# Patient Record
Sex: Female | Born: 1995 | Race: White | Hispanic: No | Marital: Married | Smoking: Former smoker
Health system: Southern US, Community
[De-identification: ages and names within clinical notes are randomized; demographics above are authoritative.]

## PROBLEM LIST (undated history)

## (undated) DIAGNOSIS — Z789 Other specified health status: Secondary | ICD-10-CM

## (undated) HISTORY — PX: TONSILLECTOMY: SUR1361

## (undated) HISTORY — PX: WISDOM TOOTH EXTRACTION: SHX21

## (undated) HISTORY — PX: ADENOIDECTOMY: SUR15

## (undated) HISTORY — DX: Other specified health status: Z78.9

## (undated) HISTORY — PX: TYMPANOSTOMY TUBE PLACEMENT: SHX32

---
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
## 2016-12-04 ENCOUNTER — Encounter (HOSPITAL_COMMUNITY): Payer: Self-pay | Admitting: Emergency Medicine

## 2016-12-04 ENCOUNTER — Ambulatory Visit (HOSPITAL_COMMUNITY)
Admission: EM | Admit: 2016-12-04 | Discharge: 2016-12-04 | Disposition: A | Discharge status: Home / Self Care | Payer: Medicaid Other | Attending: Family Medicine | Admitting: Family Medicine

## 2016-12-04 DIAGNOSIS — B9789 Other viral agents as the cause of diseases classified elsewhere: Secondary | ICD-10-CM

## 2016-12-04 DIAGNOSIS — J069 Acute upper respiratory infection, unspecified: Secondary | ICD-10-CM

## 2016-12-04 MED ORDER — CETIRIZINE-PSEUDOEPHEDRINE ER 5-120 MG PO TB12: 1.0000 | ORAL_TABLET | Freq: Every day | ORAL | 0 refills | Status: DC

## 2016-12-04 MED ORDER — GUAIFENESIN-CODEINE 100-10 MG/5ML PO SOLN: 5.0000 mL | Freq: Three times a day (TID) | ORAL | 0 refills | Status: DC | PRN

## 2016-12-04 MED ORDER — IPRATROPIUM BROMIDE 0.06 % NA SOLN: 2.0000 | Freq: Four times a day (QID) | NASAL | 0 refills | Status: DC

## 2016-12-04 NOTE — ED Triage Notes (Signed)
[facility] with a complaint of a cough and sore throat with nasal congestion x 3 days.

## 2016-12-04 NOTE — Discharge Instructions (Signed)
You most likely have a viral URI, this type of infection will not be helped by antibiotics. I advise rest, plenty of fluids and management of symptoms with over the counter medicines. For symptoms you may take Tylenol as needed every 4-6 hours for body aches or fever, not to exceed 4,000 mg a day, Take mucinex or mucinex DM ever 12 hours with a full glass of water, you may use an inhaled steroid such as Flonase, 2 sprays each nostril once a day for congestion, or an antihistamine such as Claritin or Zyrtec once a day. Another alternative for congestion, is a pseudoephedrine containing product available from the pharmacist. Should your symptoms worsen or fail to resolve, follow up with your primary care provider or return to clinic.   I have also prescribed a medicine for cough called Cheratussin, this medicine is a narcotic, it will cause drowsiness, and it is addictive. Do not take more than what is necessary, do not drink alcohol while taking, and do not operate any heavy machinery while taking this medicine.

## 2016-12-04 NOTE — ED Provider Notes (Signed)
CSN: [id number]     Arrival date & time 12/04/16  1027 History   None    Chief Complaint  Patient presents with  . Cough  . Sore Throat   (Consider location/radiation/quality/duration/timing/severity/associated sxs/prior Treatment) Michelle Cannon is a 21 y.o. female with a past history of tonsillectomy, who presents [facility] with a chief complaint of cough, congestion, runny nose, and sore throat ongoing for 4 days.   The history is provided by the patient.  Cough  Cough characteristics:  Productive, dry and hacking Sputum characteristics:  Green and yellow Severity:  Moderate Onset quality:  Gradual Duration:  4 days Timing:  Constant Progression:  Unchanged Chronicity:  New Smoker: yes   Context: upper respiratory infection   Relieved by:  Decongestant and cough suppressants Worsened by:  Lying down Associated symptoms: chills, rhinorrhea, sinus congestion and sore throat   Associated symptoms: no chest pain, no ear pain, no fever, no rash, no shortness of breath and no wheezing   Sore Throat  Pertinent negatives include no chest pain and no shortness of breath.    History reviewed. No pertinent past medical history. Past Surgical History:  Procedure Laterality Date  . ADENOIDECTOMY    . TONSILLECTOMY    . TYMPANOSTOMY TUBE PLACEMENT     History reviewed. No pertinent family history. Social History  Substance Use Topics  . Smoking status: Current Every Day Smoker    Packs/day: 0.50    Types: Cigarettes  . Smokeless tobacco: Not on file  . Alcohol use No   OB History    No data available     Review of Systems  Constitutional: Positive for chills. Negative for fever.  HENT: Positive for congestion, rhinorrhea and sore throat. Negative for ear pain, sinus pain, sinus pressure and trouble swallowing.   Respiratory: Positive for cough. Negative for shortness of breath and wheezing.   Cardiovascular: Negative for chest pain.  Gastrointestinal:  Negative.   Musculoskeletal: Negative.   Skin: Negative for rash.  Neurological: Negative.     Allergies  Codeine  Home Medications   Prior to Admission medications   Medication Sig Start Date End Date Taking? Authorizing Provider  cetirizine-pseudoephedrine (ZYRTEC-D) 5-120 MG tablet Take 1 tablet by mouth daily. 12/04/16   Dorena BodoKennard, Bliss Behnke, NP  guaiFENesin-codeine 100-10 MG/5ML syrup Take 5 mLs by mouth 3 (three) times daily as needed for cough. 12/04/16   Dorena BodoKennard, Rosaisela Jamroz, NP  ipratropium (ATROVENT) 0.06 % nasal spray Place 2 sprays into both nostrils 4 (four) times daily. 12/04/16   Dorena BodoKennard, Kaylana Fenstermacher, NP   Meds Ordered and Administered this Visit  Medications - No data to display  BP 117/74 (BP Location: Left Arm)   Pulse 74   Temp 98.2 F (36.8 C) (Oral)   Resp 16   SpO2 100%  No data found.   Physical Exam  Constitutional: She is oriented to person, place, and time. She appears well-developed and well-nourished. No distress.  HENT:  Head: Normocephalic and atraumatic.  Right Ear: Tympanic membrane and external ear normal.  Left Ear: Tympanic membrane and external ear normal.  Nose: Rhinorrhea present.  Mouth/Throat: Oropharynx is clear and moist.  Eyes: Conjunctivae are normal.  Neck: Normal range of motion.  Cardiovascular: Normal rate and regular rhythm.   Pulmonary/Chest: Effort normal and breath sounds normal.  Abdominal: Soft. Bowel sounds are normal.  Lymphadenopathy:    She has no cervical adenopathy.  Neurological: She is alert and oriented to person, place,  and time.  Skin: Skin is warm and dry. Capillary refill takes less than 2 seconds. She is not diaphoretic.  Psychiatric: She has a normal mood and affect. Her behavior is normal.  Nursing note and vitals reviewed.   Urgent Care Course     Procedures (including critical care time)  Labs Review Labs Reviewed - No data to display  Imaging Review No results found.     MDM   1. Viral URI  with cough    Michelle Cannon is a 21 y.o. female with a past history of tonsillectomy, who presents to the [facility] urgent care with a chief complaint of cough, congestion, runny nose, and sore throat ongoing for 4 days.  Centor 0/4, Eye, recommend rest, plenty of fluids, work note prescribed, Cheratussin for cough, ipratropium for runny nose, return to clinic in one week as needed.     Dorena Bodo, NP 12/04/16 [phone]

## 2020-06-11 NOTE — L&D Delivery Note (Signed)
Delivery Note At 2:23 PM a viable female was delivered via Vaginal, Spontaneous (Presentation: LOA     ).  APGAR: pending ; weight pending.   Placenta status: Spontaneous, Intact.  Cord: 3 vessels with the following complications: Nuchal x 1 - loose. Delivered through.  Cord pH:    Anesthesia: Epidural Episiotomy: None Lacerations: 1st degree Suture Repair: 3.0 vicryl Est. Blood Loss (mL):  200cc   It's a boy!!   Mom to postpartum.  Baby to Couplet care / Skin to Skin.  Ranae Pila 02/23/2021, 2:42 PM

## 2020-08-12 LAB — OB RESULTS CONSOLE ABO/RH: RH Type: POSITIVE

## 2020-08-12 LAB — OB RESULTS CONSOLE RPR: RPR: NONREACTIVE

## 2020-08-12 LAB — OB RESULTS CONSOLE ANTIBODY SCREEN: Antibody Screen: NEGATIVE

## 2020-08-12 LAB — OB RESULTS CONSOLE GC/CHLAMYDIA
Chlamydia: NEGATIVE
Gonorrhea: NEGATIVE

## 2020-08-12 LAB — OB RESULTS CONSOLE HEPATITIS B SURFACE ANTIGEN: Hepatitis B Surface Ag: NEGATIVE

## 2020-08-12 LAB — OB RESULTS CONSOLE HIV ANTIBODY (ROUTINE TESTING): HIV: NONREACTIVE

## 2020-08-12 LAB — OB RESULTS CONSOLE RUBELLA ANTIBODY, IGM: Rubella: IMMUNE

## 2020-08-15 ENCOUNTER — Inpatient Hospital Stay (HOSPITAL_COMMUNITY)
Admission: AD | Admit: 2020-08-15 | Payer: BC Managed Care – PPO | Source: Home / Self Care | Admitting: Obstetrics and Gynecology

## 2020-08-24 ENCOUNTER — Other Ambulatory Visit: Payer: Self-pay | Admitting: Obstetrics and Gynecology

## 2020-08-30 ENCOUNTER — Encounter: Payer: Self-pay | Admitting: Obstetrics and Gynecology

## 2020-09-07 ENCOUNTER — Other Ambulatory Visit: Payer: Self-pay | Admitting: Obstetrics and Gynecology

## 2020-11-18 ENCOUNTER — Inpatient Hospital Stay (HOSPITAL_COMMUNITY)
Admission: AD | Admit: 2020-11-18 | Payer: BC Managed Care – PPO | Source: Home / Self Care | Admitting: Obstetrics and Gynecology

## 2020-12-21 ENCOUNTER — Other Ambulatory Visit: Payer: Self-pay | Admitting: Obstetrics and Gynecology

## 2020-12-22 ENCOUNTER — Other Ambulatory Visit: Payer: Self-pay | Admitting: Obstetrics and Gynecology

## 2020-12-22 DIAGNOSIS — Z331 Pregnant state, incidental: Secondary | ICD-10-CM

## 2021-01-19 ENCOUNTER — Encounter: Payer: Self-pay | Admitting: *Deleted

## 2021-01-23 ENCOUNTER — Other Ambulatory Visit: Payer: Self-pay

## 2021-01-23 ENCOUNTER — Other Ambulatory Visit: Payer: Self-pay | Admitting: Obstetrics and Gynecology

## 2021-01-23 ENCOUNTER — Ambulatory Visit (HOSPITAL_BASED_OUTPATIENT_CLINIC_OR_DEPARTMENT_OTHER): Payer: BC Managed Care – PPO | Admitting: *Deleted

## 2021-01-23 ENCOUNTER — Ambulatory Visit: Payer: BC Managed Care – PPO | Admitting: *Deleted

## 2021-01-23 ENCOUNTER — Other Ambulatory Visit: Payer: Self-pay | Admitting: *Deleted

## 2021-01-23 ENCOUNTER — Ambulatory Visit: Payer: BC Managed Care – PPO | Attending: Obstetrics and Gynecology

## 2021-01-23 ENCOUNTER — Ambulatory Visit (HOSPITAL_BASED_OUTPATIENT_CLINIC_OR_DEPARTMENT_OTHER): Payer: BC Managed Care – PPO | Admitting: Maternal & Fetal Medicine

## 2021-01-23 VITALS — BP 120/72 | HR 75 | Ht 66.0 in

## 2021-01-23 DIAGNOSIS — Z3A33 33 weeks gestation of pregnancy: Secondary | ICD-10-CM

## 2021-01-23 DIAGNOSIS — O365930 Maternal care for other known or suspected poor fetal growth, third trimester, not applicable or unspecified: Secondary | ICD-10-CM | POA: Insufficient documentation

## 2021-01-23 DIAGNOSIS — O283 Abnormal ultrasonic finding on antenatal screening of mother: Secondary | ICD-10-CM

## 2021-01-23 DIAGNOSIS — Z363 Encounter for antenatal screening for malformations: Secondary | ICD-10-CM

## 2021-01-23 DIAGNOSIS — Z331 Pregnant state, incidental: Secondary | ICD-10-CM | POA: Insufficient documentation

## 2021-01-23 DIAGNOSIS — Z364 Encounter for antenatal screening for fetal growth retardation: Secondary | ICD-10-CM

## 2021-01-23 NOTE — Procedures (Signed)
Michelle Cannon March 29, 1996 [redacted]w[redacted]d  Fetus A Non-Stress Test Interpretation for 01/23/21  Indication: IUGR  Fetal Heart Rate A Mode: External Baseline Rate (A): 130 bpm Variability: Moderate Accelerations: 15 x 15 Decelerations: None Multiple birth?: No  Uterine Activity Mode: Palpation, Toco Contraction Frequency (min): none Resting Tone Palpated: Relaxed  Interpretation (Fetal Testing) Nonstress Test Interpretation: Reactive Overall Impression: Reassuring for gestational age Comments: DR. Grace Bushy reviewed tracing.

## 2021-01-24 NOTE — Progress Notes (Signed)
MFM Consultation  Ms. Buonocore is a 25 yo G1P0 at 72 w 1 d here with a single intrauterine pregnancy here for detailed exam to assess an enlarged fetal gall bladder (not visualized today).  Normal anatomy with measurements less than dates due to FGR EFW 2.7th%.  There is good fetal movement and amniotic fluid volume The UA Dopplers are normal without evidence of AEDF or REDF.  I discussed today's visit with a diagnosis of FGR. I explained that the etiology includes placental insufficiency, chronic disease, infection, aneuploidy and other genetic syndromes. She has a low risk NIPS and neg horizon. She has no additional risk factors for chronic disease. At this time I explained the diagnosis, evaluation and management to include on going fetal growth and weekly antenatal testing to include UA Dopplers. If the EFW < 3rd% or abnormal testing, I recommend delivery at 37 weeks otherwise if all is normal consider delivery at 39 weeks.   Recommendation: Initiate weekly testing with UA Dopplers Repeat growth in 4 weeks.  Consider delivery at 37 week given EFW < 3rd%.  I spent 30 minutes with > 50% in face to face consultation.  All questions answered.  Novella Olive, MD.

## 2021-02-01 ENCOUNTER — Other Ambulatory Visit: Payer: Self-pay

## 2021-02-01 ENCOUNTER — Ambulatory Visit: Payer: BC Managed Care – PPO | Admitting: *Deleted

## 2021-02-01 ENCOUNTER — Ambulatory Visit: Payer: BC Managed Care – PPO | Attending: Maternal & Fetal Medicine

## 2021-02-01 VITALS — BP 115/66 | HR 80

## 2021-02-01 DIAGNOSIS — O365930 Maternal care for other known or suspected poor fetal growth, third trimester, not applicable or unspecified: Secondary | ICD-10-CM

## 2021-02-01 DIAGNOSIS — Z3A34 34 weeks gestation of pregnancy: Secondary | ICD-10-CM

## 2021-02-08 ENCOUNTER — Other Ambulatory Visit: Payer: Self-pay

## 2021-02-08 ENCOUNTER — Ambulatory Visit: Payer: BC Managed Care – PPO | Attending: Maternal & Fetal Medicine

## 2021-02-08 ENCOUNTER — Encounter: Payer: Self-pay | Admitting: *Deleted

## 2021-02-08 ENCOUNTER — Ambulatory Visit: Payer: BC Managed Care – PPO | Admitting: *Deleted

## 2021-02-08 VITALS — BP 114/72 | HR 86

## 2021-02-08 DIAGNOSIS — O365930 Maternal care for other known or suspected poor fetal growth, third trimester, not applicable or unspecified: Secondary | ICD-10-CM

## 2021-02-08 DIAGNOSIS — Z3A35 35 weeks gestation of pregnancy: Secondary | ICD-10-CM

## 2021-02-09 ENCOUNTER — Inpatient Hospital Stay (HOSPITAL_COMMUNITY)
Admission: AD | Admit: 2021-02-09 | Discharge: 2021-02-10 | Disposition: A | Discharge status: Home / Self Care | Payer: BC Managed Care – PPO | Attending: Obstetrics & Gynecology | Admitting: Obstetrics & Gynecology

## 2021-02-09 ENCOUNTER — Inpatient Hospital Stay (HOSPITAL_BASED_OUTPATIENT_CLINIC_OR_DEPARTMENT_OTHER): Payer: BC Managed Care – PPO

## 2021-02-09 DIAGNOSIS — O368130 Decreased fetal movements, third trimester, not applicable or unspecified: Secondary | ICD-10-CM | POA: Insufficient documentation

## 2021-02-09 DIAGNOSIS — Z87891 Personal history of nicotine dependence: Secondary | ICD-10-CM | POA: Insufficient documentation

## 2021-02-09 DIAGNOSIS — O365930 Maternal care for other known or suspected poor fetal growth, third trimester, not applicable or unspecified: Secondary | ICD-10-CM | POA: Diagnosis not present

## 2021-02-09 DIAGNOSIS — Z3A35 35 weeks gestation of pregnancy: Secondary | ICD-10-CM | POA: Insufficient documentation

## 2021-02-09 DIAGNOSIS — Z79899 Other long term (current) drug therapy: Secondary | ICD-10-CM | POA: Insufficient documentation

## 2021-02-09 DIAGNOSIS — Z3689 Encounter for other specified antenatal screening: Secondary | ICD-10-CM | POA: Insufficient documentation

## 2021-02-09 NOTE — MAU Note (Addendum)
Has noticed DFM all day today. Usually if I turn on my side he "goes crazy". Today he has not been moving like his norm.Some mild ctxs but not painful. FOBs mother died suddenly on 09/27/22 so they have had a stressful wk. Denies VB or LOF. Baby is small for dates

## 2021-02-10 DIAGNOSIS — O368130 Decreased fetal movements, third trimester, not applicable or unspecified: Secondary | ICD-10-CM | POA: Diagnosis not present

## 2021-02-10 DIAGNOSIS — Z3A35 35 weeks gestation of pregnancy: Secondary | ICD-10-CM

## 2021-02-10 LAB — OB RESULTS CONSOLE GBS: GBS: NEGATIVE

## 2021-02-10 NOTE — Progress Notes (Signed)
Thalia Bloodgood CNM in earlier to discuss u/s results and d/c plan. Written and verbal d/c instructions given and understanding voiced.

## 2021-02-10 NOTE — MAU Provider Note (Signed)
History     CSN: [id number]  Arrival date and time: 02/09/21 2150   Event Date/Time   First Provider Initiated Contact with Patient 02/09/21 2250      Chief Complaint  Patient presents with   Decreased Fetal Movement   HPI Michelle Cannon is a 25 y.o. G1P0000 at [redacted]w[redacted]d who presents to MAU with chief complaint of absent fetal movement in the context of decreased fetal movement throughout the day today.  Patient states she has not felt any movement whatsoever since about 6pm this evening. She ate a regular dinner before to presenting to MAU. She denies vaginal bleeding, leaking of fluid, fever, falls, or recent illness.   Patient receives care with [facility] and her next appointment is Friday 02/10/2021.  OB History     Gravida  1   Para  0   Term  0   Preterm  0   AB  0   Living         SAB  0   IAB  0   Ectopic  0   Multiple      Live Births              Past Medical History:  Diagnosis Date   Medical history non-contributory     Past Surgical History:  Procedure Laterality Date   ADENOIDECTOMY     TONSILLECTOMY     TONSILLECTOMY     T & A   TYMPANOSTOMY TUBE PLACEMENT     WISDOM TOOTH EXTRACTION      Family History  Problem Relation Age of Onset   Diabetes Brother    Hypertension Other    Diabetes Other     Social History   Tobacco Use   Smoking status: Former    Types: Cigarettes    Quit date: 06/11/2017    Years since quitting: 3.6    Passive exposure: Current   Smokeless tobacco: Never  Vaping Use   Vaping Use: Never used  Substance Use Topics   Alcohol use: Not Currently   Drug use: Not Currently    Types: Marijuana    Comment: in college    Allergies:  Allergies  Allergen Reactions   Codeine Itching   Rocephin [Ceftriaxone] Hives    Medications Prior to Admission  Medication Sig Dispense Refill Last Dose   prenatal vitamin w/FE, FA (PRENATAL 1 + 1) 27-1 MG TABS tablet Take 1 tablet by mouth daily at 12 noon.    02/09/2021   cetirizine-pseudoephedrine (ZYRTEC-D) 5-120 MG tablet Take 1 tablet by mouth daily. 30 tablet 0    guaiFENesin-codeine 100-10 MG/5ML syrup Take 5 mLs by mouth 3 (three) times daily as needed for cough. 120 mL 0    ipratropium (ATROVENT) 0.06 % nasal spray Place 2 sprays into both nostrils 4 (four) times daily. 15 mL 0     Review of Systems  Gastrointestinal:  Positive for abdominal pain.       Irregular, mild contractions  All other systems reviewed and are negative. Physical Exam   Blood pressure 131/76, pulse 81, temperature 98 F (36.7 C), resp. rate 16, height 5\' 6"  (1.676 m), weight 78.5 kg, last menstrual period 06/05/2020.  Physical Exam Vitals and nursing note reviewed. Exam conducted with a chaperone present.  Constitutional:      Appearance: Normal appearance.  Cardiovascular:     Rate and Rhythm: Normal rate.     Pulses: Normal pulses.  Pulmonary:     Effort: Pulmonary effort  is normal.  Abdominal:     Comments: Gravid  Skin:    Capillary Refill: Capillary refill takes less than 2 seconds.  Neurological:     Mental Status: She is alert and oriented to person, place, and time.  Psychiatric:        Mood and Affect: Mood normal.        Behavior: Behavior normal.        Thought Content: Thought content normal.        Judgment: Judgment normal.    MAU Course  Procedures --Reactive tracing x 1 hour of prolonged monitoring --Patient pushed NST button 4 times during monitoring --BPP ordered --Patient reporting mild contractions, declines cervical exam  Patient Vitals for the past 24 hrs:  BP Temp Pulse Resp Height Weight  02/10/21 0013 120/75 -- 98 16 -- --  02/09/21 2239 131/76 -- 81 -- -- --  02/09/21 2215 123/80 -- 80 -- -- --  02/09/21 2214 -- 98 F (36.7 C) -- 16 5\' 6"  (1.676 m) 78.5 kg    Assessment and Plan  --25 y.o. G1P0000 at [redacted]w[redacted]d  --Reactive tracing --BPP 8/8 --Discharge home in stable condition  F/U: --Patient's next OB  appointment is later today (02/10/2021)  04/12/2021, CNM 02/10/2021, 2:42 AM

## 2021-02-14 ENCOUNTER — Ambulatory Visit (HOSPITAL_BASED_OUTPATIENT_CLINIC_OR_DEPARTMENT_OTHER): Payer: BC Managed Care – PPO

## 2021-02-14 ENCOUNTER — Other Ambulatory Visit: Payer: Self-pay

## 2021-02-14 ENCOUNTER — Ambulatory Visit: Payer: BC Managed Care – PPO | Attending: Maternal & Fetal Medicine | Admitting: *Deleted

## 2021-02-14 ENCOUNTER — Encounter: Payer: Self-pay | Admitting: *Deleted

## 2021-02-14 VITALS — BP 122/75 | HR 78

## 2021-02-14 DIAGNOSIS — Z3A36 36 weeks gestation of pregnancy: Secondary | ICD-10-CM | POA: Diagnosis not present

## 2021-02-14 DIAGNOSIS — O365930 Maternal care for other known or suspected poor fetal growth, third trimester, not applicable or unspecified: Secondary | ICD-10-CM

## 2021-02-15 ENCOUNTER — Telehealth (HOSPITAL_COMMUNITY): Payer: Self-pay | Admitting: *Deleted

## 2021-02-15 NOTE — Telephone Encounter (Signed)
Preadmission screen  

## 2021-02-16 ENCOUNTER — Encounter (HOSPITAL_COMMUNITY): Payer: Self-pay | Admitting: *Deleted

## 2021-02-21 ENCOUNTER — Other Ambulatory Visit: Payer: Self-pay | Admitting: Obstetrics and Gynecology

## 2021-02-21 LAB — SARS CORONAVIRUS 2 (TAT 6-24 HRS): SARS Coronavirus 2: NEGATIVE

## 2021-02-21 NOTE — H&P (Signed)
Michelle Cannon is a 24 y.o. female presenting for IOL s/s severe IUGR. She has been followed closely with MFM and antenatal testing has been reassuring including BPPs and serial UAD. However IUGR has remained severe and they recommended delivery at in the 37 week period. Last US demonstrated EFW in 4%ile (4#15). Panorama showed RR fetus. She has a history of endometriosis.   OB History     Gravida  1   Para  0   Term  0   Preterm  0   AB  0   Living         SAB  0   IAB  0   Ectopic  0   Multiple      Live Births             Past Medical History:  Diagnosis Date   Medical history non-contributory    Past Surgical History:  Procedure Laterality Date   ADENOIDECTOMY     TONSILLECTOMY     TONSILLECTOMY     T & A   TYMPANOSTOMY TUBE PLACEMENT     WISDOM TOOTH EXTRACTION     Family History: family history includes Diabetes in her brother and another family member; Hypertension in an other family member. Social History:  reports that she quit smoking about 3 years ago. Her smoking use included cigarettes. She has been exposed to tobacco smoke. She has never used smokeless tobacco. She reports that she does not currently use alcohol. She reports that she does not currently use drugs after having used the following drugs: Marijuana.     Maternal Diabetes: No Genetic Screening: Normal Maternal Ultrasounds/Referrals: IUGR Fetal Ultrasounds or other Referrals:  Referred to Materal Fetal Medicine  Maternal Substance Abuse:  No Significant Maternal Medications:  None Significant Maternal Lab Results:  None and Group B Strep negative Other Comments:  None  Review of Systems History   Last menstrual period 06/05/2020. Exam Physical Exam  (from office) NAD, A&O NWOB Abd soft, nondistended, gravid  Prenatal labs: ABO, Rh: A/Positive/-- (03/04 0000) Antibody: Negative (03/04 0000) Rubella: Immune (03/04 0000) RPR: Nonreactive (03/04 0000)  HBsAg: Negative (03/04  0000)  HIV: Non-reactive (03/04 0000)  GBS:   NEGATIVE  Assessment/Plan: 25 yo G1P0 @ 37.4 wga presenting for IOL s/s severe IUGR (EFW 4.1%ile). Cervix unfavorable. Plan for cytotec followed by pitocin/AROM when more favorable.  GBS negative.     Ranae Pila 02/21/2021, 12:43 PM

## 2021-02-22 ENCOUNTER — Other Ambulatory Visit: Payer: Self-pay

## 2021-02-23 ENCOUNTER — Inpatient Hospital Stay (HOSPITAL_COMMUNITY): Payer: BC Managed Care – PPO | Admitting: Anesthesiology

## 2021-02-23 ENCOUNTER — Inpatient Hospital Stay (HOSPITAL_COMMUNITY): Payer: BC Managed Care – PPO

## 2021-02-23 ENCOUNTER — Inpatient Hospital Stay (HOSPITAL_COMMUNITY)
Admission: AD | Admit: 2021-02-23 | Discharge: 2021-02-25 | DRG: 807 | Disposition: A | Discharge status: Home / Self Care | Payer: BC Managed Care – PPO | Attending: Obstetrics and Gynecology | Admitting: Obstetrics and Gynecology

## 2021-02-23 ENCOUNTER — Encounter (HOSPITAL_COMMUNITY): Payer: Self-pay | Admitting: Obstetrics and Gynecology

## 2021-02-23 DIAGNOSIS — O365930 Maternal care for other known or suspected poor fetal growth, third trimester, not applicable or unspecified: Secondary | ICD-10-CM | POA: Diagnosis present

## 2021-02-23 DIAGNOSIS — Z23 Encounter for immunization: Secondary | ICD-10-CM | POA: Diagnosis not present

## 2021-02-23 DIAGNOSIS — Z3A37 37 weeks gestation of pregnancy: Secondary | ICD-10-CM | POA: Diagnosis not present

## 2021-02-23 DIAGNOSIS — Z3490 Encounter for supervision of normal pregnancy, unspecified, unspecified trimester: Secondary | ICD-10-CM

## 2021-02-23 DIAGNOSIS — Z87891 Personal history of nicotine dependence: Secondary | ICD-10-CM | POA: Diagnosis not present

## 2021-02-23 LAB — TYPE AND SCREEN
ABO/RH(D): A POS
Antibody Screen: NEGATIVE

## 2021-02-23 LAB — CBC
HCT: 39.6 % (ref 36.0–46.0)
Hemoglobin: 13.2 g/dL (ref 12.0–15.0)
MCH: 31.7 pg (ref 26.0–34.0)
MCHC: 33.3 g/dL (ref 30.0–36.0)
MCV: 95 fL (ref 80.0–100.0)
Platelets: 241 10*3/uL (ref 150–400)
RBC: 4.17 MIL/uL (ref 3.87–5.11)
RDW: 12.6 % (ref 11.5–15.5)
WBC: 10.5 10*3/uL (ref 4.0–10.5)
nRBC: 0 % (ref 0.0–0.2)

## 2021-02-23 LAB — RPR: RPR Ser Ql: NONREACTIVE

## 2021-02-23 MED ORDER — LIDOCAINE HCL (PF) 1 % IJ SOLN
INTRAMUSCULAR | Status: DC | PRN
  Administered 2021-02-23: 5 mL via EPIDURAL

## 2021-02-23 MED ORDER — SIMETHICONE 80 MG PO CHEW: 80.0000 mg | CHEWABLE_TABLET | ORAL | Status: DC | PRN

## 2021-02-23 MED ORDER — EPHEDRINE 5 MG/ML INJ: 10.0000 mg | INTRAVENOUS | Status: DC | PRN

## 2021-02-23 MED ORDER — HYDROXYZINE HCL 50 MG PO TABS: 50.0000 mg | ORAL_TABLET | Freq: Four times a day (QID) | ORAL | Status: DC | PRN

## 2021-02-23 MED ORDER — ACETAMINOPHEN 325 MG PO TABS
650.0000 mg | ORAL_TABLET | ORAL | Status: DC | PRN
  Administered 2021-02-24: 650 mg via ORAL
  Filled 2021-02-23: qty 2

## 2021-02-23 MED ORDER — OXYTOCIN-SODIUM CHLORIDE 30-0.9 UT/500ML-% IV SOLN: 2.5000 [IU]/h | INTRAVENOUS | Status: DC

## 2021-02-23 MED ORDER — IBUPROFEN 600 MG PO TABS
600.0000 mg | ORAL_TABLET | Freq: Four times a day (QID) | ORAL | Status: DC
  Administered 2021-02-23 – 2021-02-25 (×7): 600 mg via ORAL
  Filled 2021-02-23 (×7): qty 1

## 2021-02-23 MED ORDER — LACTATED RINGERS IV SOLN: 500.0000 mL | INTRAVENOUS | Status: DC | PRN

## 2021-02-23 MED ORDER — ONDANSETRON HCL 4 MG/2ML IJ SOLN
4.0000 mg | Freq: Four times a day (QID) | INTRAMUSCULAR | Status: DC | PRN
Start: 2021-02-23 — End: 2021-02-23

## 2021-02-23 MED ORDER — OXYTOCIN-SODIUM CHLORIDE 30-0.9 UT/500ML-% IV SOLN
1.0000 m[IU]/min | INTRAVENOUS | Status: DC
Start: 2021-02-23 — End: 2021-02-23
  Administered 2021-02-23: 2 m[IU]/min via INTRAVENOUS
  Administered 2021-02-23: 4 m[IU]/min via INTRAVENOUS
  Filled 2021-02-23: qty 500

## 2021-02-23 MED ORDER — BENZOCAINE-MENTHOL 20-0.5 % EX AERO
1.0000 "application " | INHALATION_SPRAY | CUTANEOUS | Status: DC | PRN
  Administered 2021-02-23: 1 via TOPICAL
  Filled 2021-02-23: qty 56

## 2021-02-23 MED ORDER — DIBUCAINE (PERIANAL) 1 % EX OINT: 1.0000 "application " | TOPICAL_OINTMENT | CUTANEOUS | Status: DC | PRN

## 2021-02-23 MED ORDER — PRENATAL MULTIVITAMIN CH
1.0000 | ORAL_TABLET | Freq: Every day | ORAL | Status: DC
  Administered 2021-02-24: 1 via ORAL
  Filled 2021-02-23: qty 1

## 2021-02-23 MED ORDER — DIPHENHYDRAMINE HCL 25 MG PO CAPS: 25.0000 mg | ORAL_CAPSULE | Freq: Four times a day (QID) | ORAL | Status: DC | PRN

## 2021-02-23 MED ORDER — TERBUTALINE SULFATE 1 MG/ML IJ SOLN: 0.2500 mg | Freq: Once | INTRAMUSCULAR | Status: DC | PRN

## 2021-02-23 MED ORDER — PHENYLEPHRINE 40 MCG/ML (10ML) SYRINGE FOR IV PUSH (FOR BLOOD PRESSURE SUPPORT): 80.0000 ug | PREFILLED_SYRINGE | INTRAVENOUS | Status: DC | PRN

## 2021-02-23 MED ORDER — ZOLPIDEM TARTRATE 5 MG PO TABS: 5.0000 mg | ORAL_TABLET | Freq: Every evening | ORAL | Status: DC | PRN

## 2021-02-23 MED ORDER — ACETAMINOPHEN 325 MG PO TABS: 650.0000 mg | ORAL_TABLET | ORAL | Status: DC | PRN

## 2021-02-23 MED ORDER — SENNOSIDES-DOCUSATE SODIUM 8.6-50 MG PO TABS
2.0000 | ORAL_TABLET | Freq: Every day | ORAL | Status: DC
  Administered 2021-02-24: 2 via ORAL
  Filled 2021-02-23: qty 2

## 2021-02-23 MED ORDER — DIPHENHYDRAMINE HCL 50 MG/ML IJ SOLN: 12.5000 mg | INTRAMUSCULAR | Status: DC | PRN

## 2021-02-23 MED ORDER — LACTATED RINGERS IV SOLN
500.0000 mL | Freq: Once | INTRAVENOUS | Status: AC
  Administered 2021-02-23: 500 mL via INTRAVENOUS

## 2021-02-23 MED ORDER — TETANUS-DIPHTH-ACELL PERTUSSIS 5-2.5-18.5 LF-MCG/0.5 IM SUSY
0.5000 mL | PREFILLED_SYRINGE | Freq: Once | INTRAMUSCULAR | Status: AC
  Administered 2021-02-24: 0.5 mL via INTRAMUSCULAR
  Filled 2021-02-23: qty 0.5

## 2021-02-23 MED ORDER — FENTANYL-BUPIVACAINE-NACL 0.5-0.125-0.9 MG/250ML-% EP SOLN
12.0000 mL/h | EPIDURAL | Status: DC | PRN
  Filled 2021-02-23: qty 250

## 2021-02-23 MED ORDER — LACTATED RINGERS IV SOLN: INTRAVENOUS | Status: DC

## 2021-02-23 MED ORDER — LIDOCAINE HCL (PF) 1 % IJ SOLN: 30.0000 mL | INTRAMUSCULAR | Status: DC | PRN

## 2021-02-23 MED ORDER — COCONUT OIL OIL
1.0000 "application " | TOPICAL_OIL | Status: DC | PRN
  Administered 2021-02-23: 1 via TOPICAL

## 2021-02-23 MED ORDER — MISOPROSTOL 25 MCG QUARTER TABLET
25.0000 ug | ORAL_TABLET | ORAL | Status: DC | PRN
  Administered 2021-02-23 (×2): 25 ug via BUCCAL
  Filled 2021-02-23 (×2): qty 1

## 2021-02-23 MED ORDER — BUTORPHANOL TARTRATE 1 MG/ML IJ SOLN
1.0000 mg | INTRAMUSCULAR | Status: DC | PRN
  Administered 2021-02-23: 1 mg via INTRAVENOUS
  Filled 2021-02-23: qty 1

## 2021-02-23 MED ORDER — SOD CITRATE-CITRIC ACID 500-334 MG/5ML PO SOLN: 30.0000 mL | ORAL | Status: DC | PRN

## 2021-02-23 MED ORDER — ONDANSETRON HCL 4 MG PO TABS: 4.0000 mg | ORAL_TABLET | ORAL | Status: DC | PRN

## 2021-02-23 MED ORDER — ONDANSETRON HCL 4 MG/2ML IJ SOLN: 4.0000 mg | INTRAMUSCULAR | Status: DC | PRN

## 2021-02-23 MED ORDER — OXYTOCIN BOLUS FROM INFUSION
333.0000 mL | Freq: Once | INTRAVENOUS | Status: AC
  Administered 2021-02-23: 333 mL via INTRAVENOUS

## 2021-02-23 MED ORDER — FENTANYL-BUPIVACAINE-NACL 0.5-0.125-0.9 MG/250ML-% EP SOLN
EPIDURAL | Status: DC | PRN
  Administered 2021-02-23: 12 mL/h via EPIDURAL

## 2021-02-23 MED ORDER — WITCH HAZEL-GLYCERIN EX PADS: 1.0000 "application " | MEDICATED_PAD | CUTANEOUS | Status: DC | PRN

## 2021-02-23 NOTE — Anesthesia Procedure Notes (Signed)
Epidural Patient location during procedure: OB Start time: 02/23/2021 10:06 AM End time: 02/23/2021 10:21 AM  Staffing Anesthesiologist: Trevor Iha, MD Performed: anesthesiologist   Preanesthetic Checklist Completed: patient identified, IV checked, site marked, risks and benefits discussed, surgical consent, monitors and equipment checked, pre-op evaluation and timeout performed  Epidural Patient position: sitting Prep: DuraPrep and site prepped and draped Patient monitoring: continuous pulse ox and blood pressure Approach: midline Location: L3-L4 Injection technique: LOR air  Needle:  Needle type: Tuohy  Needle gauge: 17 G Needle length: 9 cm and 9 Needle insertion depth: 7 cm Catheter type: closed end flexible Catheter size: 19 Gauge Catheter at skin depth: 12 cm Test dose: negative  Assessment Events: blood not aspirated, injection not painful, no injection resistance, no paresthesia and negative IV test  Additional Notes Patient identified. Risks/Benefits/Options discussed with patient including but not limited to bleeding, infection, nerve damage, paralysis, failed block, incomplete pain control, headache, blood pressure changes, nausea, vomiting, reactions to medication both or allergic, itching and postpartum back pain. Confirmed with bedside nurse the patient's most recent platelet count. Confirmed with patient that they are not currently taking any anticoagulation, have any bleeding history or any family history of bleeding disorders. Patient expressed understanding and wished to proceed. All questions were answered. Sterile technique was used throughout the entire procedure. Please see nursing notes for vital signs. Test dose was given through epidural needle and negative prior to continuing to dose epidural or start infusion. Warning signs of high block given to the patient including shortness of breath, tingling/numbness in hands, complete motor block, or any  concerning symptoms with instructions to call for help. Patient was given instructions on fall risk and not to get out of bed. All questions and concerns addressed with instructions to call with any issues. 1 Attempt (S) . Patient tolerated procedure well.

## 2021-02-23 NOTE — Lactation Note (Signed)
This note was copied from a baby's chart. Lactation Consultation Note  Patient Name: Michelle Cannon [id number] Date: 02/23/2021 Reason for consult: Initial assessment;Mother's request;Difficult latch;Primapara;1st time breastfeeding;Early term 37-38.6wks;Nipple pain/trauma Age:26 hours  Mom states her nipples are sensitive and she does not like them " to be touched" Mom trying latching infant since arriving on the floor, caused her tenderness.  Nipples are red but no other abrasions or trauma noted.  Mom like to supplement with DBM and get pumping to offer EBM via bottle for now.  After resting and getting some food, may try latching at breast at a later feeding.   Plan 1. To feed based on cues 8-12x 24 hr period 2. Mom to offer breast when ready latching infant in prone position to get better depth.  3. Mom to supplement with EBM first followed by [facility]. BF supplementation guide provided. Mom to offer more if unable to latch infant at the breast.  4. DEBP q 3hrs for 15 min. Mom stated currently 24 flange good fit. Mom understands with pumping flange size may change.  5. I and O sheet reviewed.  6. LC brochure of inpatient and outpatient services reviewed. All questions answered at the end of the visit.   Maternal Data Has patient been taught Hand Expression?: Yes Does the patient have breastfeeding experience prior to this delivery?: No  Feeding Mother's Current Feeding Choice: Breast Milk and Donor Milk  LATCH Score Latch: Repeated attempts needed to sustain latch, nipple held in mouth throughout feeding, stimulation needed to elicit sucking reflex.  Audible Swallowing: None  Type of Nipple: Everted at rest and after stimulation (areola edema)  Comfort (Breast/Nipple): Filling, red/small blisters or bruises, mild/mod discomfort (pinching to start)  Hold (Positioning): Assistance needed to correctly position infant at breast and maintain latch.  LATCH Score: 5   Lactation Tools  Discussed/Used Tools: Pump;Flanges;Coconut oil Flange Size: 24 Breast pump type: Double-Electric Breast Pump Pump Education: Setup, frequency, and cleaning;Milk Storage Reason for Pumping: increase stimulation Pumping frequency: every 3 hrs for 15 min  Interventions Interventions: Breast feeding basics reviewed;Breast compression;Assisted with latch;Adjust position;Skin to skin;Support pillows;DEBP;Breast massage;Position options;Hand express;Expressed milk;Education;Coconut oil;Pace feeding  Discharge Pump: Personal WIC Program: No  Consult Status Consult Status: Follow-up Date: 02/24/21 Follow-up type: In-patient    Caeley Dohrmann  Nicholson-Springer 02/23/2021, 6:38 PM

## 2021-02-23 NOTE — Progress Notes (Signed)
Doing well. Arom clear fluid. Sve 2/80/-2. Start Pitocin.

## 2021-02-23 NOTE — Lactation Note (Signed)
This note was copied from a baby's chart. Lactation Consultation Note  Patient Name: Michelle Cannon [id number] Date: 02/23/2021 Reason for consult: L&D Initial assessment;Primapara;1st time breastfeeding;Early term 37-38.6wks;Other (Comment) (LC visit at 35 mins / Baby STS with mom . per mom Melissa the RN assisted  1st latch. baby off the breast / LC offered to re-latch and mom receptive. Baby Latched on and off the Rt. for 15 mins/ switched to the Lt. / on and off 15 mins / baby fussy. STS) Age:58 hours  Maternal Data Has patient been taught Hand Expression?:  (per mom several breast changes) Does the patient have breastfeeding experience prior to this delivery?: No  Feeding    LATCH Score Latch: Repeated attempts needed to sustain latch, nipple held in mouth throughout feeding, stimulation needed to elicit sucking reflex.  Audible Swallowing: None  Type of Nipple: Everted at rest and after stimulation (areola edema)  Comfort (Breast/Nipple): Filling, red/small blisters or bruises, mild/mod discomfort (pinching to start)  Hold (Positioning): Assistance needed to correctly position infant at breast and maintain latch.  LATCH Score: 5   Lactation Tools Discussed/Used    Interventions Interventions: Breast feeding basics reviewed;Assisted with latch;Skin to skin;Breast compression;Adjust position;Support pillows;Education  Discharge    Consult Status Consult Status: Follow-up from L&D Date: 02/23/21 Follow-up type: In-patient    Matilde Sprang Mertha Clyatt 02/23/2021, 4:01 PM

## 2021-02-23 NOTE — Progress Notes (Signed)
C/c/+1 Push

## 2021-02-23 NOTE — Anesthesia Preprocedure Evaluation (Signed)
Anesthesia Evaluation  Patient identified by MRN, date of birth, ID band Patient awake    Reviewed: Allergy & Precautions, NPO status , Patient's Chart, lab work & pertinent test results  Airway Mallampati: II  TM Distance: >3 FB Neck ROM: Full    Dental no notable dental hx. (+) Teeth Intact, Dental Advisory Given   Pulmonary former smoker,    Pulmonary exam normal breath sounds clear to auscultation       Cardiovascular Exercise Tolerance: Good Normal cardiovascular exam Rhythm:Regular Rate:Normal     Neuro/Psych negative neurological ROS  negative psych ROS   GI/Hepatic negative GI ROS, Neg liver ROS,   Endo/Other    Renal/GU negative Renal ROS     Musculoskeletal   Abdominal   Peds  Hematology Lab Results      Component                Value               Date                      WBC                      10.5                02/23/2021                HGB                      13.2                02/23/2021                HCT                      39.6                02/23/2021                MCV                      95.0                02/23/2021                PLT                      241                 02/23/2021              Anesthesia Other Findings   Reproductive/Obstetrics (+) Pregnancy                             Anesthesia Physical Anesthesia Plan  ASA: 2  Anesthesia Plan: Epidural   Post-op Pain Management:    Induction:   PONV Risk Score and Plan:   Airway Management Planned:   Additional Equipment:   Intra-op Plan:   Post-operative Plan:   Informed Consent: I have reviewed the patients History and Physical, chart, labs and discussed the procedure including the risks, benefits and alternatives for the proposed anesthesia with the patient or authorized representative who has indicated his/her understanding and acceptance.       Plan Discussed with:    Anesthesia Plan Comments: (37.1 wk  primagravida for LEA)        Anesthesia Quick Evaluation

## 2021-02-24 LAB — CBC
HCT: 35.7 % — ABNORMAL LOW (ref 36.0–46.0)
Hemoglobin: 12.1 g/dL (ref 12.0–15.0)
MCH: 32.2 pg (ref 26.0–34.0)
MCHC: 33.9 g/dL (ref 30.0–36.0)
MCV: 94.9 fL (ref 80.0–100.0)
Platelets: 215 10*3/uL (ref 150–400)
RBC: 3.76 MIL/uL — ABNORMAL LOW (ref 3.87–5.11)
RDW: 12.7 % (ref 11.5–15.5)
WBC: 12 10*3/uL — ABNORMAL HIGH (ref 4.0–10.5)
nRBC: 0 % (ref 0.0–0.2)

## 2021-02-24 NOTE — Anesthesia Postprocedure Evaluation (Signed)
Anesthesia Post Note  Patient: Michelle Cannon  Procedure(s) Performed: AN AD HOC LABOR EPIDURAL     Patient location during evaluation: Mother Baby Anesthesia Type: Epidural Level of consciousness: awake and alert Pain management: pain level controlled Vital Signs Assessment: post-procedure vital signs reviewed and stable Respiratory status: spontaneous breathing, nonlabored ventilation and respiratory function stable Cardiovascular status: stable Postop Assessment: no headache, no backache and epidural receding Anesthetic complications: no   No notable events documented.  Last Vitals:  Vitals:   02/24/21 0155 02/24/21 0540  BP: 117/65 127/71  Pulse: 84 73  Resp: 18 17  Temp: 36.7 C 36.7 C  SpO2: 100% 100%    Last Pain:  Vitals:   02/24/21 0910  TempSrc:   PainSc: 0-No pain   Pain Goal:                Epidural/Spinal Function Cutaneous sensation: Normal sensation (02/24/21 0910), Patient able to flex knees: Yes (02/24/21 0910), Patient able to lift hips off bed: Yes (02/24/21 0910), Back pain beyond tenderness at insertion site: No (02/24/21 0910), Progressively worsening motor and/or sensory loss: No (02/24/21 0910), Bowel and/or bladder incontinence post epidural: No (02/24/21 0910)  [facility] Records

## 2021-02-24 NOTE — Progress Notes (Signed)
PPD # 1  No complaints  BP 127/71 (BP Location: Left Arm)   Pulse 73   Temp 98 F (36.7 C) (Oral)   Resp 17   Ht 5\' 6"  (1.676 m)   Wt 80.6 kg   LMP 06/05/2020   SpO2 100%   Breastfeeding Unknown   BMI 28.67 kg/m  Results for orders placed or performed during the hospital encounter of 02/23/21 (from the past 24 hour(s))  CBC     Status: Abnormal   Collection Time: 02/24/21  5:08 AM  Result Value Ref Range   WBC 12.0 (H) 4.0 - 10.5 K/uL   RBC 3.76 (L) 3.87 - 5.11 MIL/uL   Hemoglobin 12.1 12.0 - 15.0 g/dL   HCT 02/26/21 (L) 52.7 - 78.2 %   MCV 94.9 80.0 - 100.0 fL   MCH 32.2 26.0 - 34.0 pg   MCHC 33.9 30.0 - 36.0 g/dL   RDW 42.3 53.6 - 14.4 %   Platelets 215 150 - 400 K/uL   nRBC 0.0 0.0 - 0.2 %   Lochia WNL PPD # 1  Doing well Routine care Circ done Discharge  home tomorrow

## 2021-02-24 NOTE — Lactation Note (Signed)
This note was copied from a baby's chart. Lactation Consultation Note  Patient Name: Michelle Cannon [id number]'R Date: 02/24/2021 Reason for consult: Follow-up assessment;1st time breastfeeding;Primapara;Early term 37-38.6wks;Nipple pain/trauma;Other (Comment) (per mom preference is to just pump and bottle feed. mom called to have flange check. Mom pumping with #24 F- and it appeared to be very snug bilaterally. LC increased to #84F bilaterally and and per mom more comfortable/ LC able to increase suction 2-3) Age:76 hours  Maternal Data    Feeding Mother's Current Feeding Choice: Breast Milk and Donor Milk  LATCH Score                    Lactation Tools Discussed/Used Tools: Pump Flange Size: 24;27 Breast pump type: Double-Electric Breast Pump  Interventions    Discharge Pump: Personal;DEBP  Consult Status Consult Status: Follow-up Date: 02/25/21 Follow-up type: In-patient    Matilde Sprang Greenlee Ancheta 02/24/2021, 1:24 PM

## 2021-02-25 NOTE — Discharge Summary (Signed)
Postpartum Discharge Summary  Date of Service updated February 25, 2021     Patient Name: Michelle Cannon DOB: 11-07-1995 MRN: [id number]  Date of admission: 02/23/2021 Delivery date:02/23/2021  Delivering provider: Tyson Dense  Date of discharge: 02/25/2021  Admitting diagnosis: Pregnancy [Z34.90] Intrauterine pregnancy: [redacted]w[redacted]d     Secondary diagnosis:  Active Problems:   Pregnancy  Additional problems: none    Discharge diagnosis: Term Pregnancy Delivered                                              Post partum procedures: not applicable Augmentation: AROM, Pitocin, and Cytotec Complications: None  Hospital course: Induction of Labor With Vaginal Delivery   25 y.o. yo G1P1001 at [redacted]w[redacted]d was admitted to the hospital 02/23/2021 for induction of labor.  Indication for induction:  IUGR .  Patient had an uncomplicated labor course as follows: Membrane Rupture Time/Date: 8:18 AM ,02/23/2021   Delivery Method:Vaginal, Spontaneous  Episiotomy: None  Lacerations:  1st degree  Details of delivery can be found in separate delivery note.  Patient had a routine postpartum course. Patient is discharged home 02/25/21.  Newborn Data: Birth date:02/23/2021  Birth time:2:23 PM  Gender:Female  Living status:Living  Apgars:9 ,9  Weight:2740 g   Magnesium Sulfate received: No BMZ received: No Rhophylac:N/A MMR:No T-DaP:Given prenatally Flu: N/A Transfusion:No  Physical exam  Vitals:   02/24/21 0155 02/24/21 0540 02/24/21 2145 02/25/21 0615  BP: 117/65 127/71 121/66 108/64  Pulse: 84 73 93 68  Resp: $Remo'18 17 18 18  'oYajN$ Temp: 98 F (36.7 C) 98 F (36.7 C) 97.9 F (36.6 C) 97.8 F (36.6 C)  TempSrc: Oral Oral Oral Oral  SpO2: 100% 100% 99% 98%  Weight:      Height:       General: alert, cooperative, and no distress Lochia: appropriate Uterine Fundus: firm Incision: Healing well with no significant drainage DVT Evaluation: No evidence of DVT seen on physical exam. Labs: Lab  Results  Component Value Date   WBC 12.0 (H) 02/24/2021   HGB 12.1 02/24/2021   HCT 35.7 (L) 02/24/2021   MCV 94.9 02/24/2021   PLT 215 02/24/2021   CMP Latest Ref Rng & Units 07/19/2013  Glucose 65 - 99 mg/dL 101(H)  BUN 9 - 21 mg/dL 12  Creatinine 0.60 - 1.30 mg/dL 0.63  Sodium 132 - 141 mmol/L 137  Potassium 3.3 - 4.7 mmol/L 4.0  Chloride 97 - 107 mmol/L 105  CO2 16 - 25 mmol/L 26(H)  Calcium 9.0 - 10.7 mg/dL 8.8(L)  Total Protein 6.4 - 8.6 g/dL 7.5  Total Bilirubin 0.2 - 1.0 mg/dL 0.4  Alkaline Phos Unit/L 45  AST 0 - 26 Unit/L 20  ALT 12 - 78 U/L 17   Edinburgh Score: Edinburgh Postnatal Depression Scale Screening Tool 02/24/2021  I have been able to laugh and see the funny side of things. 0  I have looked forward with enjoyment to things. 0  I have blamed myself unnecessarily when things went wrong. 1  I have been anxious or worried for no good reason. 1  I have felt scared or panicky for no good reason. 0  Things have been getting on top of me. 1  I have been so unhappy that I have had difficulty sleeping. 0  I have felt sad or miserable. 0  I have been  so unhappy that I have been crying. 0  The thought of harming myself has occurred to me. 0  Edinburgh Postnatal Depression Scale Total 3      After visit meds:  Allergies as of 02/25/2021       Reactions   Codeine Itching   Rocephin [ceftriaxone] Hives        Medication List     TAKE these medications    prenatal vitamin w/FE, FA 27-1 MG Tabs tablet Take 1 tablet by mouth daily at 12 noon.         Discharge home in stable condition Infant Feeding: Breast Infant Disposition:home with mother Discharge instruction: per After Visit Summary and Postpartum booklet. Activity: Advance as tolerated. Pelvic rest for 6 weeks.  Diet: routine diet Anticipated Birth Control: Unsure Postpartum Appointment:6 weeks Additional Postpartum F/U:  6 weeks Future Appointments:No future appointments. Follow up  Visit:      02/25/2021 Cyril Mourning, MD

## 2021-02-25 NOTE — Lactation Note (Signed)
This note was copied from a baby's chart. Lactation Consultation Note  Patient Name: Michelle Cannon PHXTA'V Date: 02/25/2021 Reason for consult: Follow-up assessment;Primapara;1st time breastfeeding;Early term 37-38.6wks;Infant weight loss;Other (Comment) (6 % weight loss / LC reviewed doc flow sheets with mom and WNL for D/C . mom exclusively pumping and bottle feeding) Age:26 hours LC reviewed BF D/C teaching and mom has the [facility] brochure with resources.   Maternal Data Has patient been taught Hand Expression?: Yes  Feeding Mother's Current Feeding Choice: Breast Milk and Formula Nipple Type: Extra Slow Flow  LATCH Score                    Lactation Tools Discussed/Used Tools: Pump Flange Size: 24;27;Other (comment) (per mom more comfortable with the #27 F / LC mentioned to mom when she is home using her own DEBP the #24 may be comfortable) Breast pump type: Double-Electric Breast Pump Pump Education: Milk Storage Pumping frequency: LC stressed the importance of consistent pumping at least 8 times a day - 8- 10 times would be the goal Pumped volume: 0 mL  Interventions Interventions: Breast feeding basics reviewed;Education;DEBP  Discharge Discharge Education: Engorgement and breast care Pump: Personal;DEBP  Consult Status Consult Status: Complete Date: 02/25/21    Matilde Sprang Kavita Bartl 02/25/2021, 8:58 AM

## 2021-03-08 ENCOUNTER — Telehealth (HOSPITAL_COMMUNITY): Payer: Self-pay | Admitting: *Deleted

## 2021-03-08 NOTE — Telephone Encounter (Signed)
Mom reports feeling good. No concerns about herself. EPDS=5 (hospital score=3) Mom reports baby is well. Feeding, peeing, and pooping without difficulty. Sleeps in bassinet on back in mom's room. ABC's of safe sleep reviewed. Mom has no concerns about baby.  Duffy Rhody, RN 03-08-2021 at 2:08pm

## 2022-11-19 ENCOUNTER — Other Ambulatory Visit: Payer: Self-pay

## 2022-11-19 DIAGNOSIS — R519 Headache, unspecified: Secondary | ICD-10-CM

## 2022-11-19 DIAGNOSIS — R55 Syncope and collapse: Secondary | ICD-10-CM

## 2022-11-19 DIAGNOSIS — R42 Dizziness and giddiness: Secondary | ICD-10-CM

## 2022-11-21 ENCOUNTER — Encounter: Payer: Self-pay | Admitting: Family Medicine

## 2022-11-21 ENCOUNTER — Other Ambulatory Visit: Payer: Self-pay | Admitting: Family Medicine

## 2022-11-21 DIAGNOSIS — R519 Headache, unspecified: Secondary | ICD-10-CM

## 2022-11-21 DIAGNOSIS — R55 Syncope and collapse: Secondary | ICD-10-CM

## 2022-11-21 DIAGNOSIS — R42 Dizziness and giddiness: Secondary | ICD-10-CM

## 2022-11-25 ENCOUNTER — Ambulatory Visit
Admission: RE | Admit: 2022-11-25 | Discharge: 2022-11-25 | Disposition: A | Discharge status: Home / Self Care | Payer: BC Managed Care – PPO | Source: Ambulatory Visit | Attending: Family Medicine | Admitting: Family Medicine

## 2022-11-25 DIAGNOSIS — R519 Headache, unspecified: Secondary | ICD-10-CM

## 2022-11-25 DIAGNOSIS — R42 Dizziness and giddiness: Secondary | ICD-10-CM

## 2022-11-25 DIAGNOSIS — R55 Syncope and collapse: Secondary | ICD-10-CM

## 2023-05-03 IMAGING — US US MFM FETAL BPP W/O NON-STRESS
1 series · 12 of 28 positions shown · non-contrast
Comparison: none

[Series 1: us mfm fetal bpp w/o non-stress · 40 acquisitions, 12 frames shown]
[im 2/40]
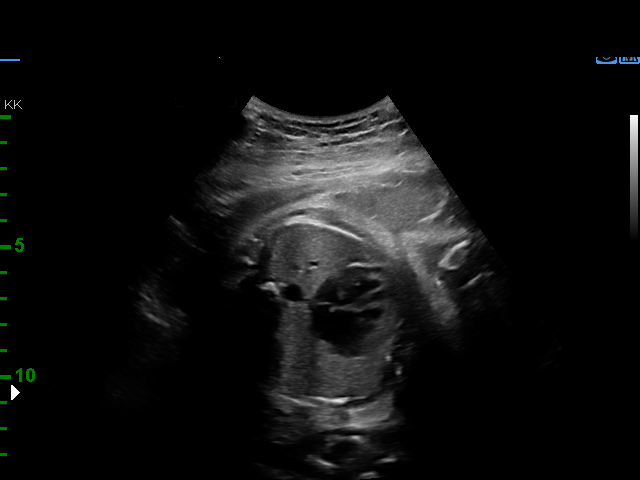
[im 5/40]
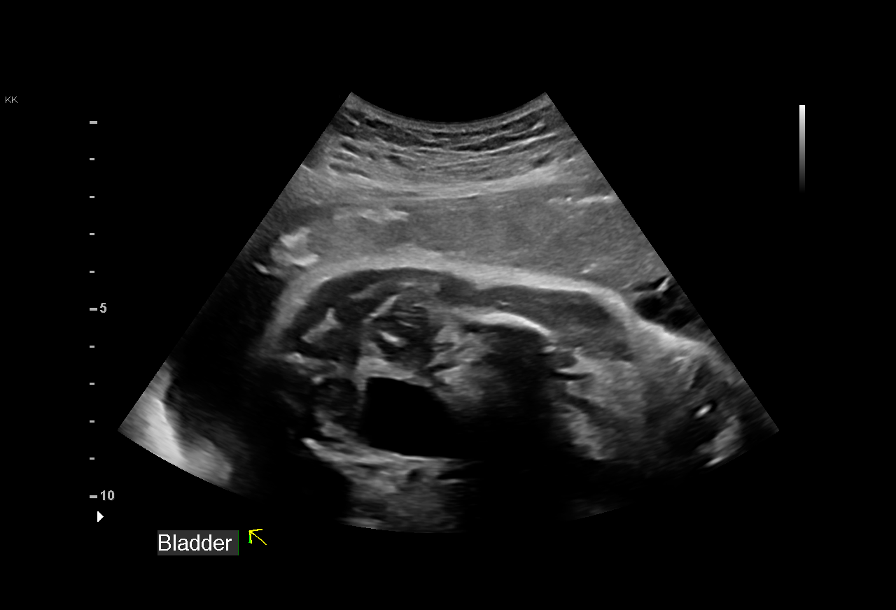
[im 8/40]
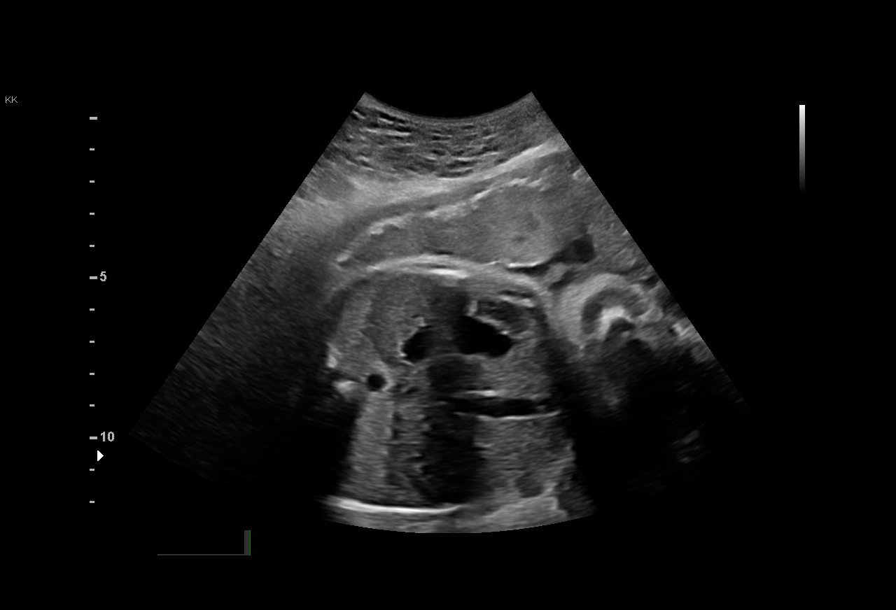
[im 12/40]
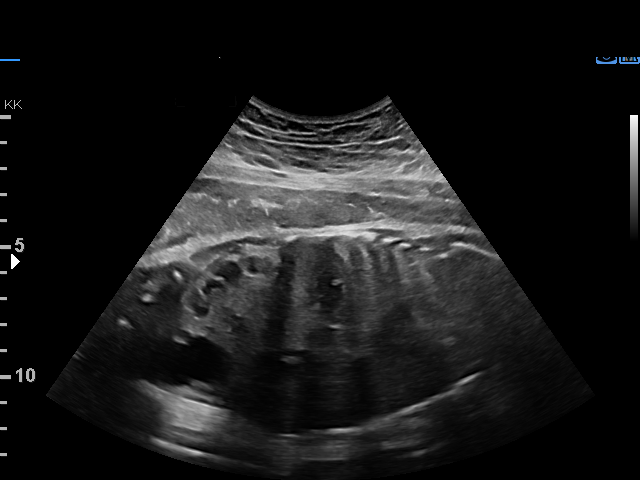
[im 15/40]
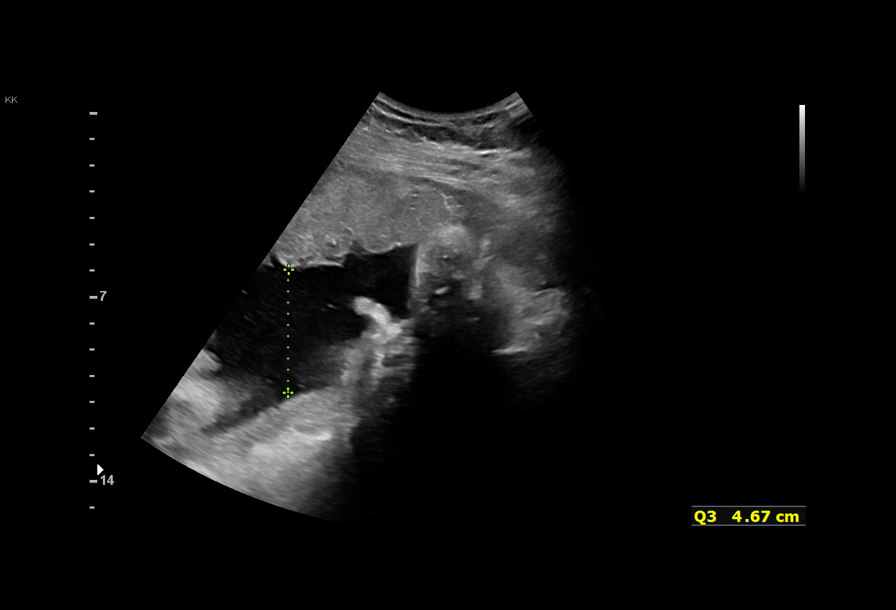
[im 18/40]
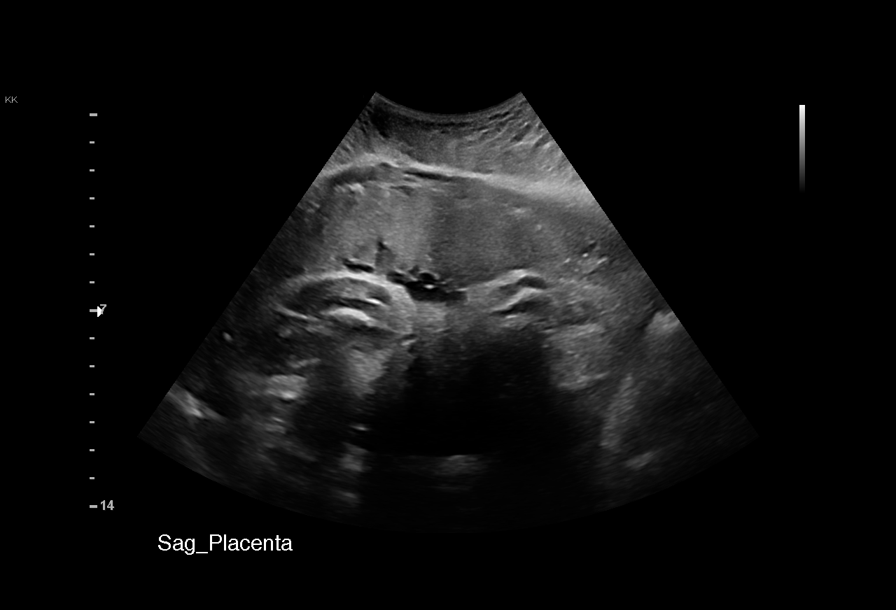
[im 22/40]
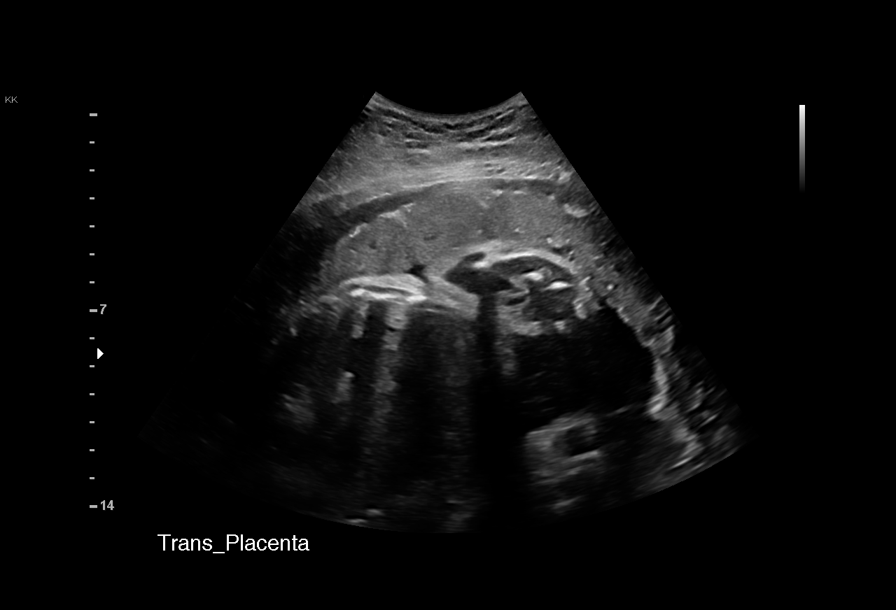
[im 25/40]
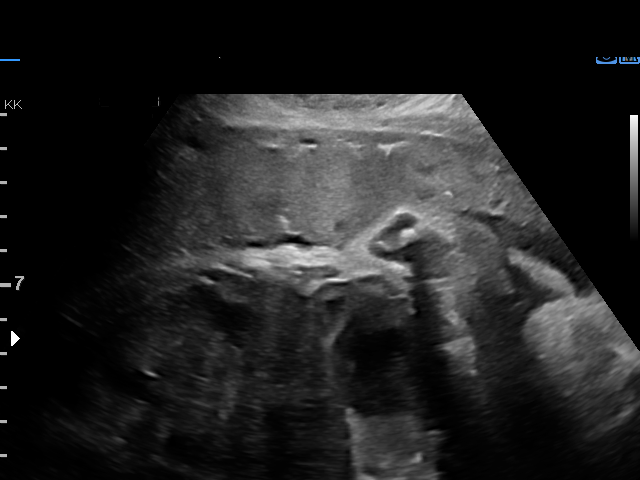
[im 28/40]
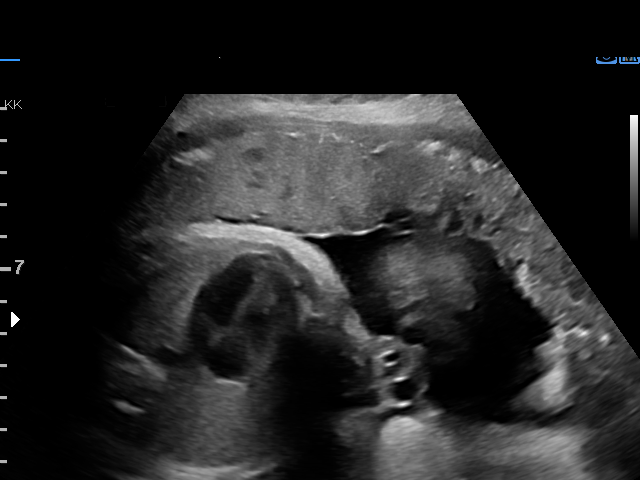
[im 32/40]
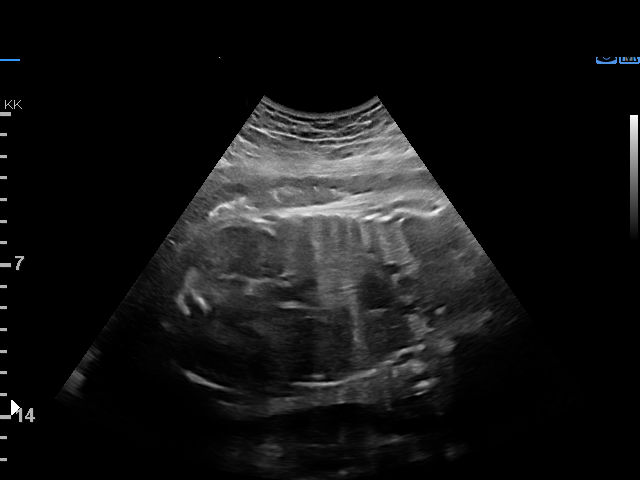
[im 35/40]
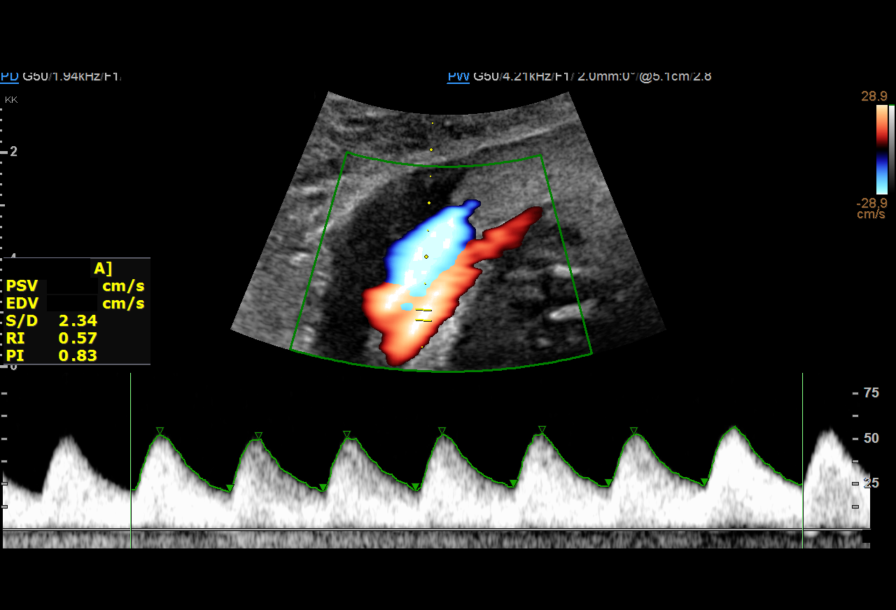
[im 38/40]
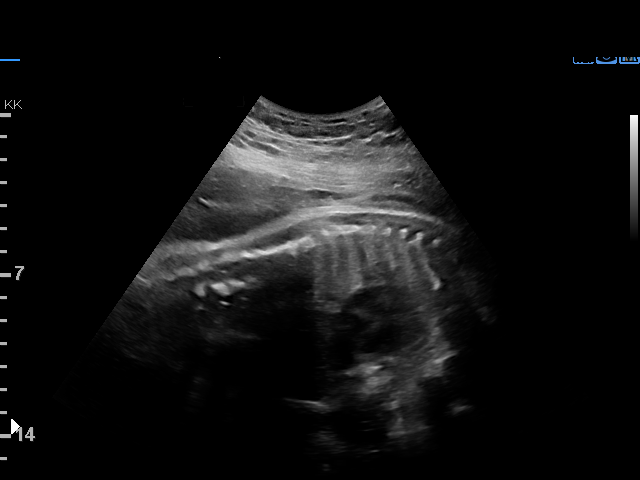

[12 of 28 positions shown; findings below may reference images not displayed]

2  US MFM UA CORD DOPPLER                76820.02    [id number]
                                                      AXHAR

Indications

 Maternal care for known or suspected poor
 fetal growth, third trimester, not applicable or
 unspecified IUGR
 LR NIPS, Neg Horizon, Neg AFP
 35 weeks gestation of pregnancy
Fetal Evaluation

 Num Of Fetuses:         1
 Fetal Heart Rate(bpm):  148
 Cardiac Activity:       Observed
 Presentation:           Cephalic
 Placenta:               Anterior
 P. Cord Insertion:      Previously Visualized

 Amniotic Fluid
 AFI FV:      Within normal limits

 AFI Sum(cm)     %Tile       Largest Pocket(cm)
 15.6            57

 RUQ(cm)                     LUQ(cm)        LLQ(cm)

Biophysical Evaluation

 Amniotic F.V:   Pocket => 2 cm             F. Tone:        Observed
 F. Movement:    Observed                   Score:          [DATE]
 F. Breathing:   Observed
OB History

 Gravidity:    1
 Living:       0
Gestational Age

 LMP:           35w 3d        Date:  06/05/20                 EDD:   03/12/21
 Best:          35w 3d     Det. By:  LMP  (06/05/20)          EDD:   03/12/21
Anatomy

 Diaphragm:             Appears normal         Bladder:                Appears normal
 Stomach:               Appears normal, left
                        sided
Doppler - Fetal Vessels

 Umbilical Artery
  S/D     %tile                                              ADFV    RDFV
  2.38       46                                                 No      No

Cervix Uterus Adnexa

 Cervix
 Not visualized (advanced GA >87wks)
Impression

 Fetal growth restriction.  On previous growth assessment, the
 estimated fetal weight was at the 3rd percentile.  Patient does
 not have gestational diabetes.  Blood pressure today at her
 office is 114/72 mmHg.

 Amniotic fluid is normal and good fetal activity is seen
 .Antenatal testing is reassuring. BPP [DATE].  Umbilical artery
 Doppler showed normal forward diastolic flow.

 We reassured the patient of the findings.  I discussed timing
 of delivery.  Because of severe fetal growth restriction seen
 on previous ultrasound, I recommend delivery at 37 weeks
 gestation.
Recommendations

 - Fetal growth assessment, UA Doppler and BPP next week.
 -Delivery at 37 weeks gestation.
                 Espin, Joh

## 2023-05-04 IMAGING — US US MFM FETAL BPP W/O NON-STRESS
1 series · 13 of 22 positions shown · non-contrast
Comparison: none

[Series 1: us mfm fetal bpp w/o non-stress · 22 acquisitions, 13 frames shown]
[im 1/22]
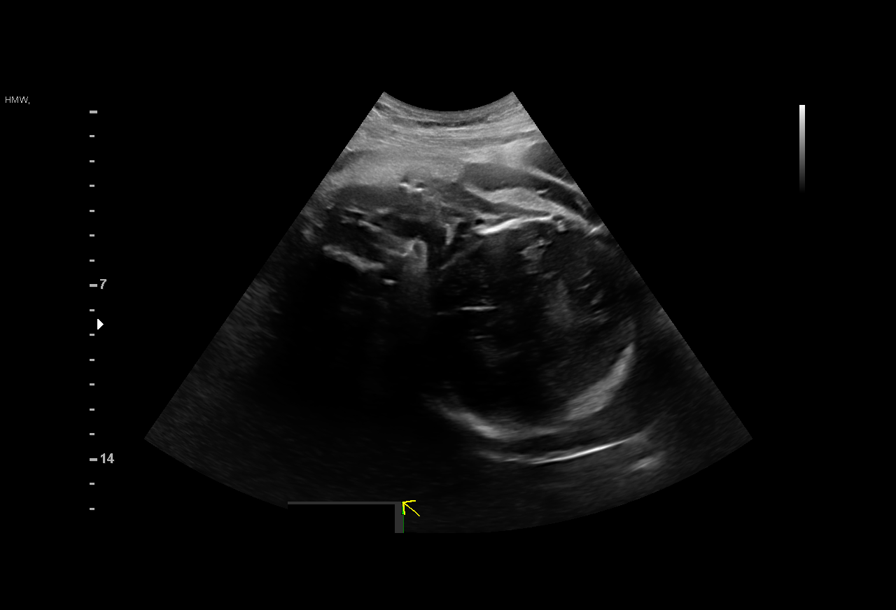
[im 3/22]
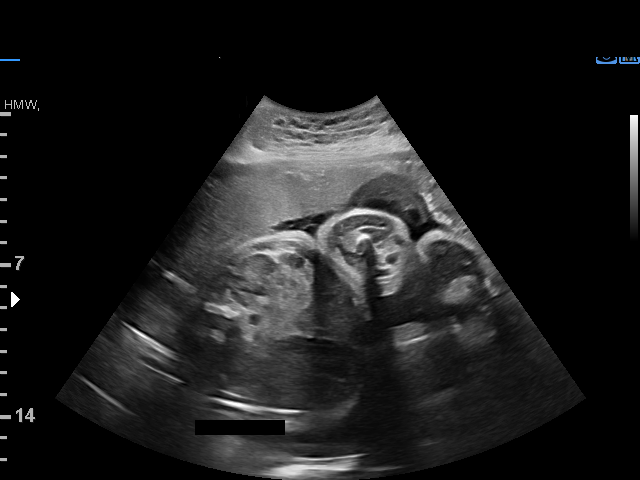
[im 5/22]
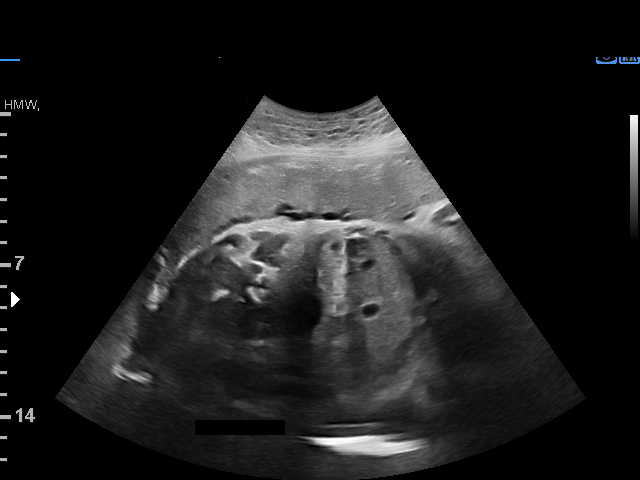
[im 6/22]
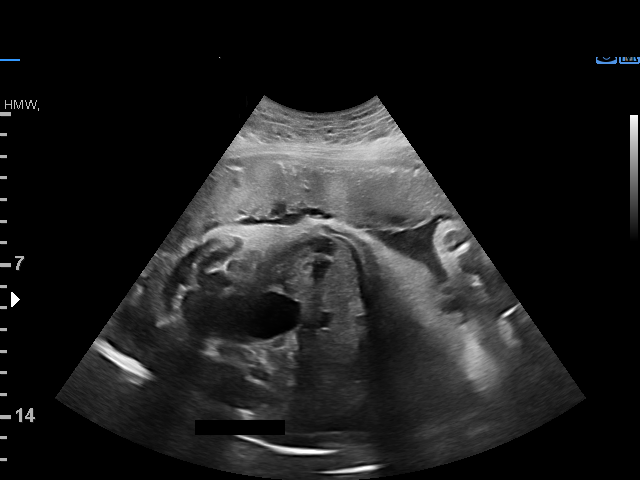
[im 8/22]
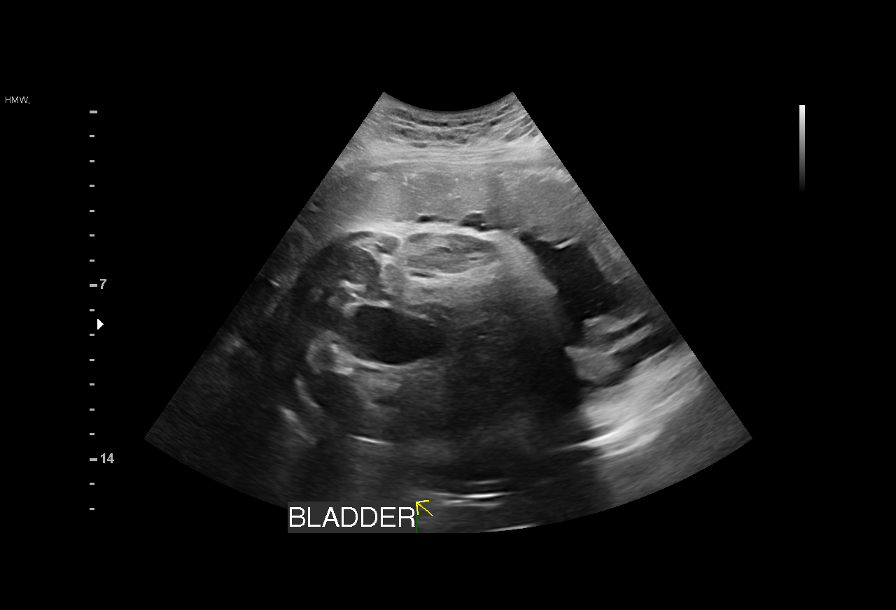
[im 10/22]
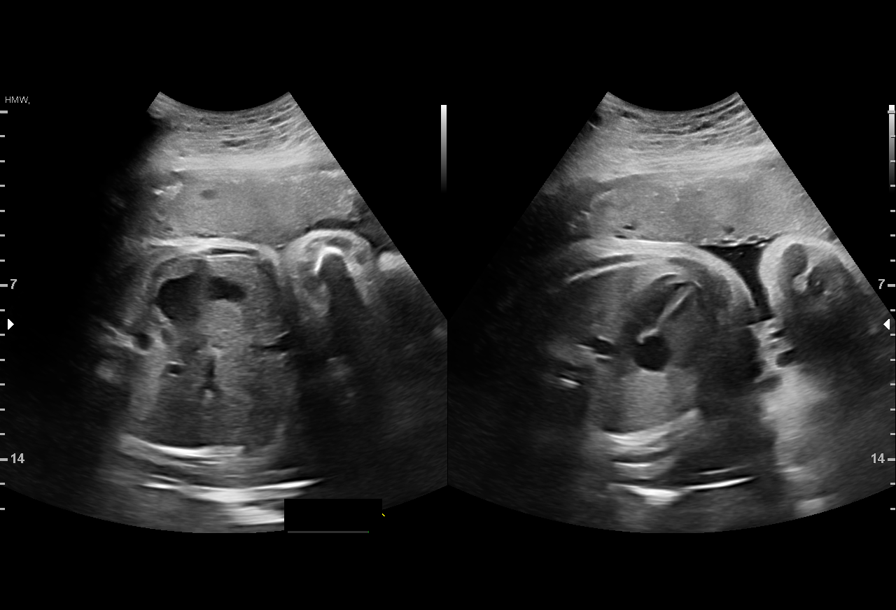
[im 12/22]
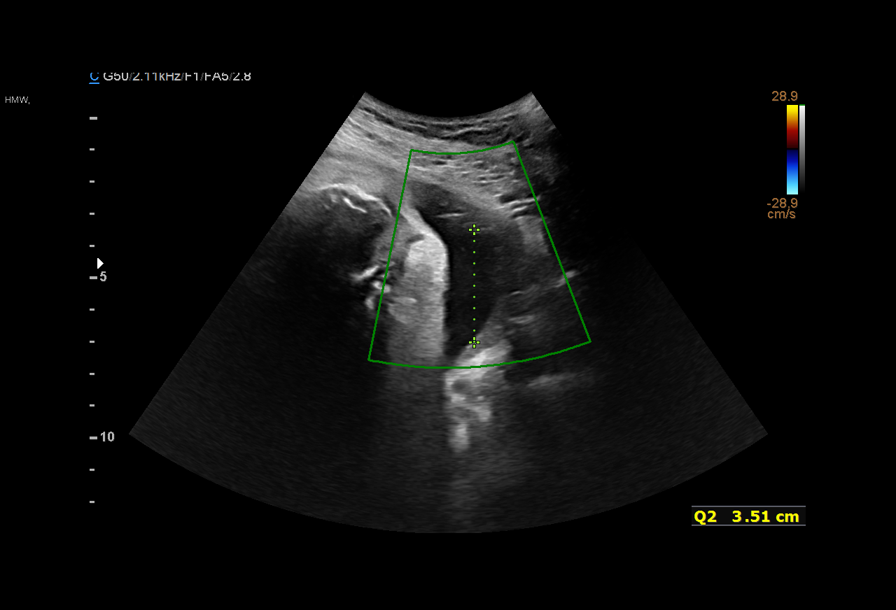
[im 13/22]
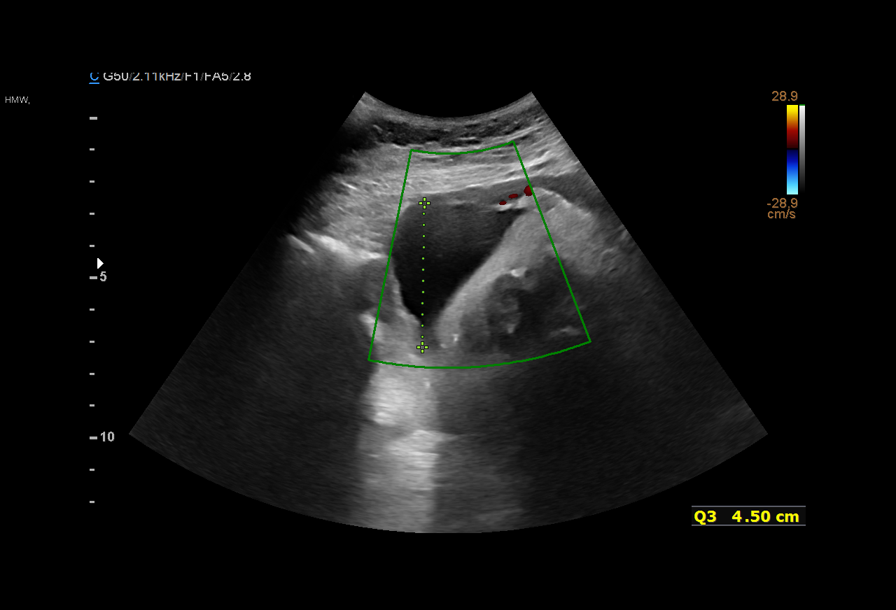
[im 15/22]
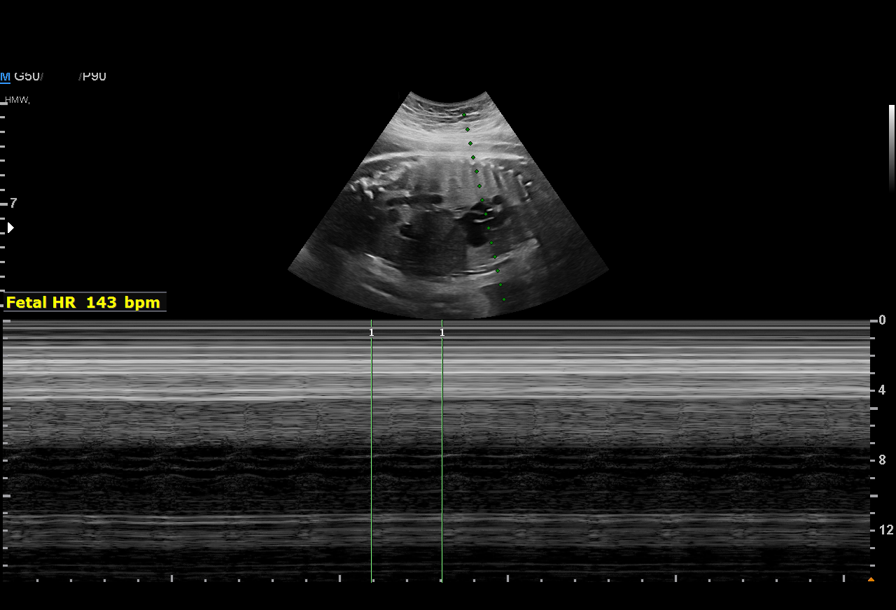
[im 17/22]
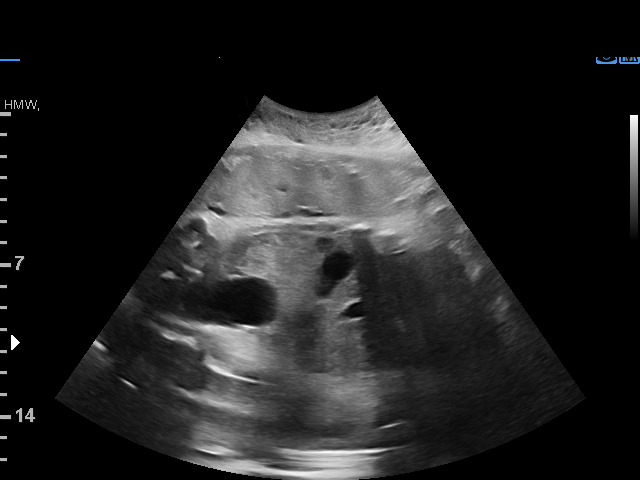
[im 18/22]
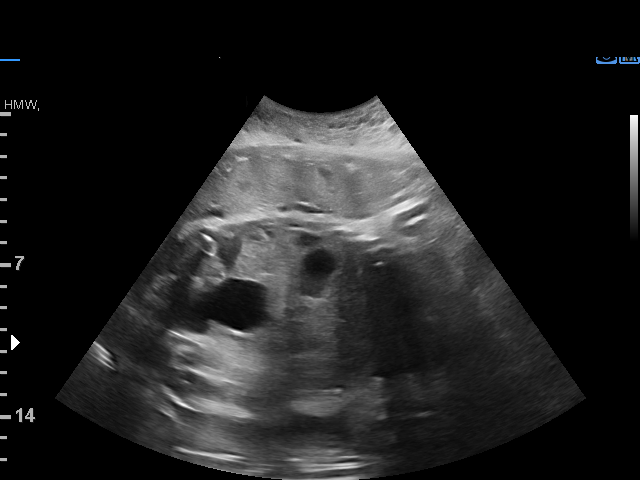
[im 20/22]
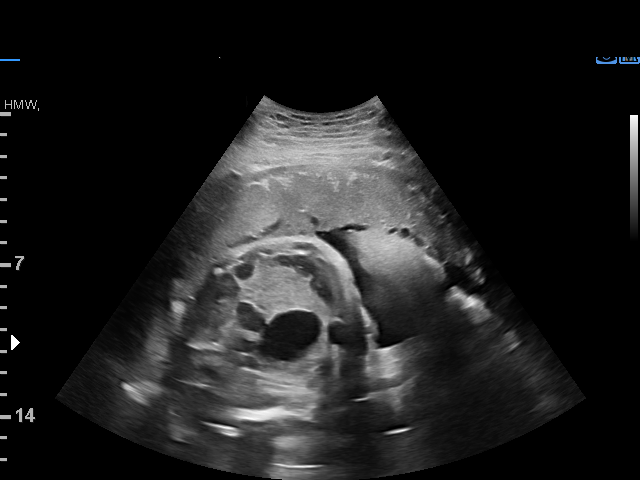
[im 22/22]
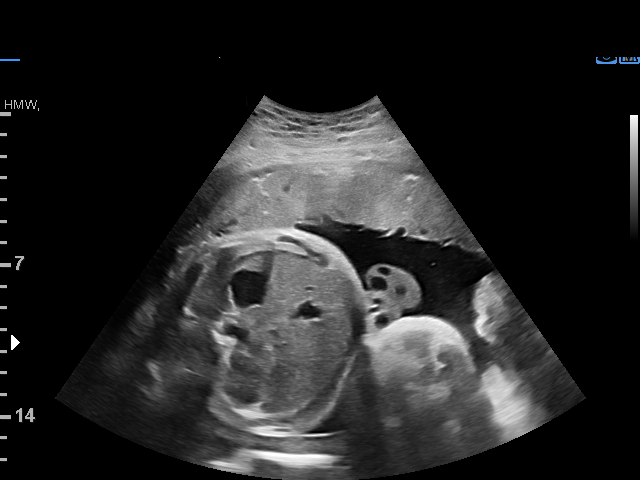

[13 of 22 positions shown; findings below may reference images not displayed]

STRESS                                            FRANKOL

Indications

 Decreased fetal movements, third trimester,
 unspecified
 35 weeks gestation of pregnancy
 Maternal care for known or suspected poor
 fetal growth, third trimester, not applicable or
 unspecified IUGR
 Encounter for antenatal screening,
 unspecified
Fetal Evaluation

 Num Of Fetuses:         1
 Fetal Heart Rate(bpm):  143
 Cardiac Activity:       Observed
 Presentation:           Cephalic

 Amniotic Fluid
 AFI FV:      Within normal limits

 AFI Sum(cm)     %Tile       Largest Pocket(cm)
 14.6            53

 RUQ(cm)       RLQ(cm)       LUQ(cm)        LLQ(cm)
 2
Biophysical Evaluation

 Amniotic F.V:   Within normal limits       F. Tone:        Observed
 F. Movement:    Observed                   Score:          [DATE]
 F. Breathing:   Observed
OB History

 Gravidity:    1
 Living:       0
Gestational Age

 LMP:           35w 4d        Date:  06/05/20                 EDD:   03/12/21
 Best:          35w 4d     Det. By:  LMP  (06/05/20)          EDD:   03/12/21
Anatomy

 Thoracic:              Appears normal         Bladder:                Appears normal
 Stomach:               Appears normal, left
                        sided
Comments

 This patient presented to the [facility] complaining of decreased
 fetal movements.
 A biophysical profile performed today was [DATE].
 There was normal amniotic fluid noted on today's ultrasound
 exam.
 The fetus was in the vertex presentation.

## 2023-05-09 IMAGING — US US MFM FETAL BPP W/O NON-STRESS
1 series · 13 of 28 positions shown · non-contrast
Comparison: none

[Series 1: us mfm fetal bpp w/o non-stress · 44 acquisitions, 13 frames shown]
[im 2/44]
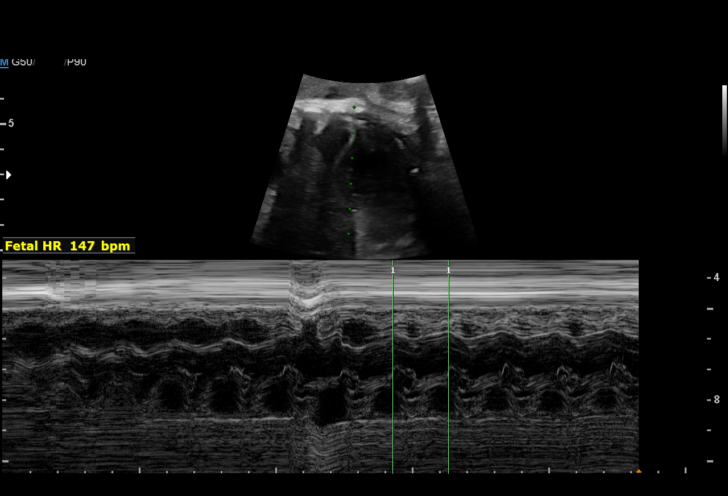
[im 5/44]
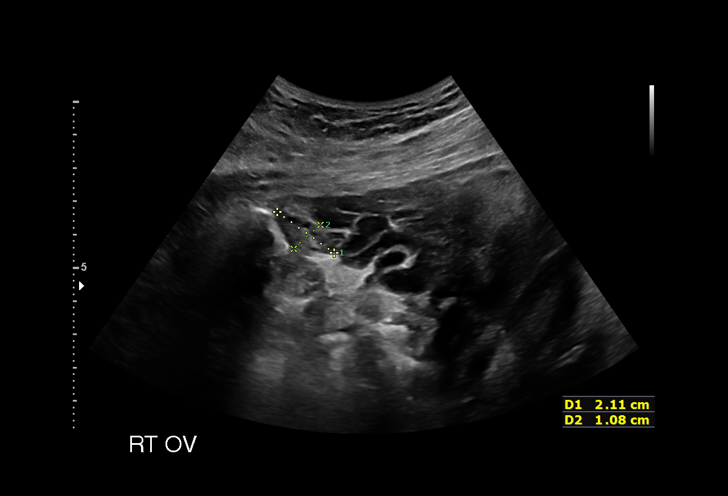
[im 8/44]
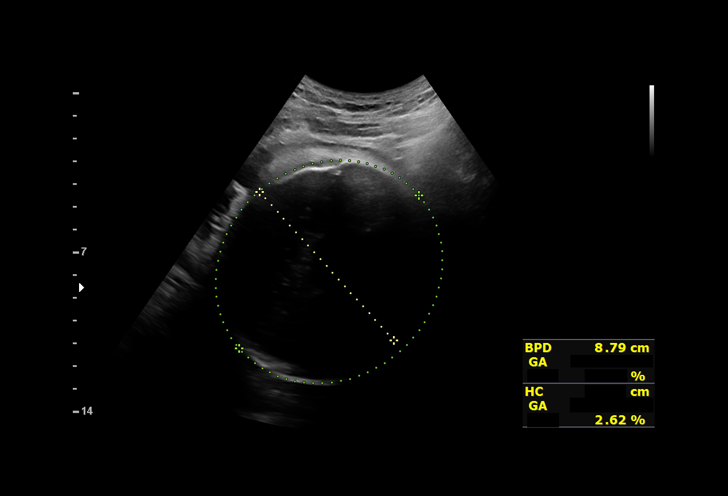
[im 12/44]
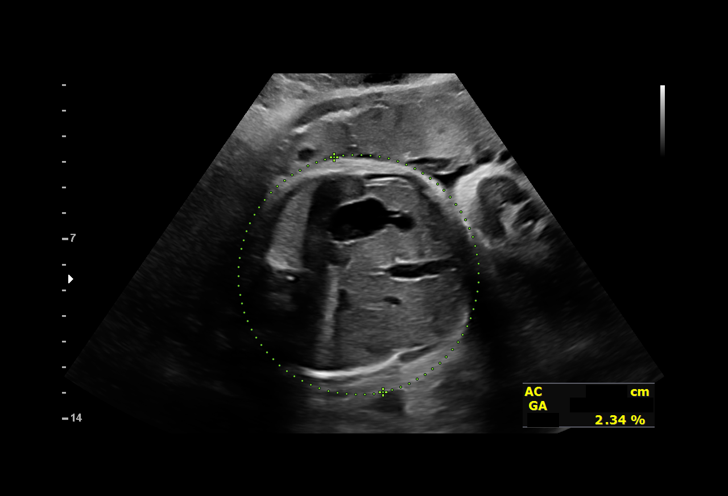
[im 15/44]
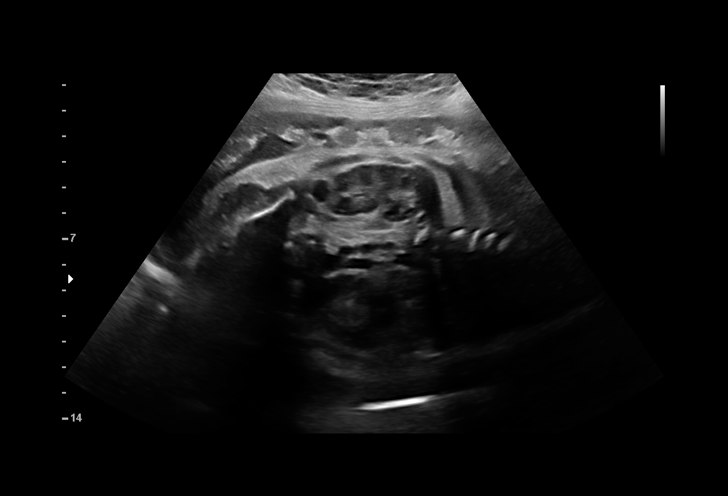
[im 18/44]
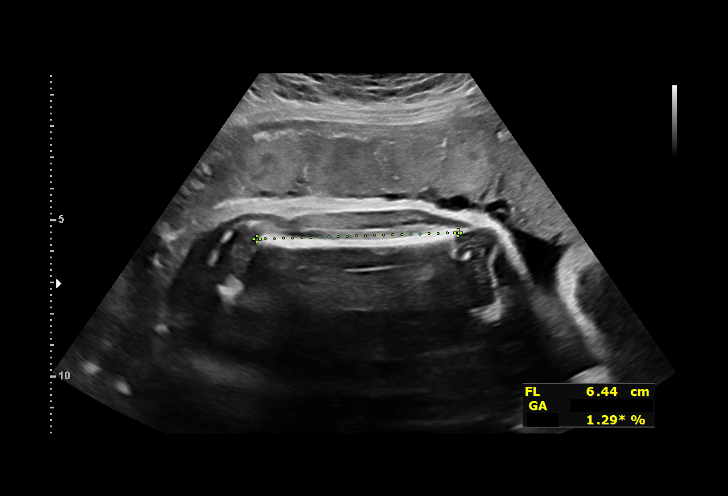
[im 23/44]
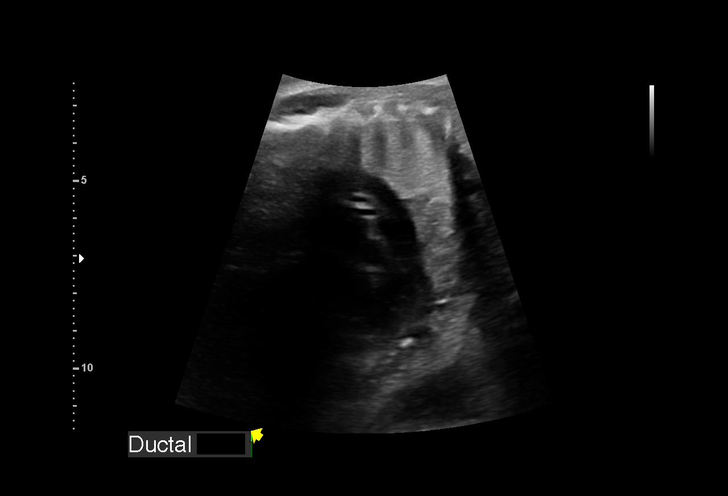
[im 26/44]
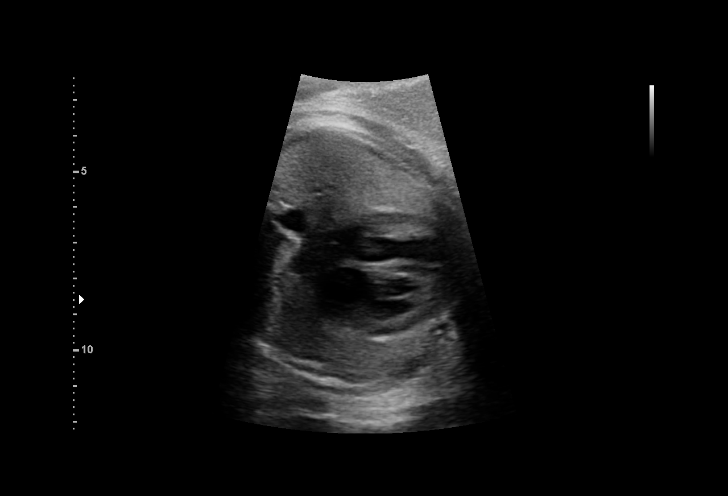
[im 29/44]
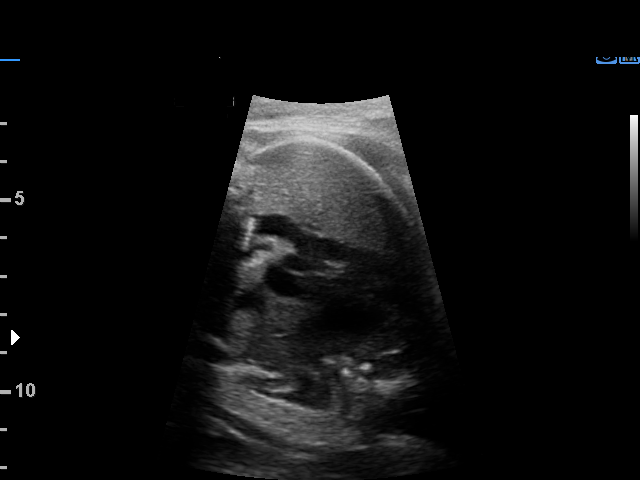
[im 32/44]
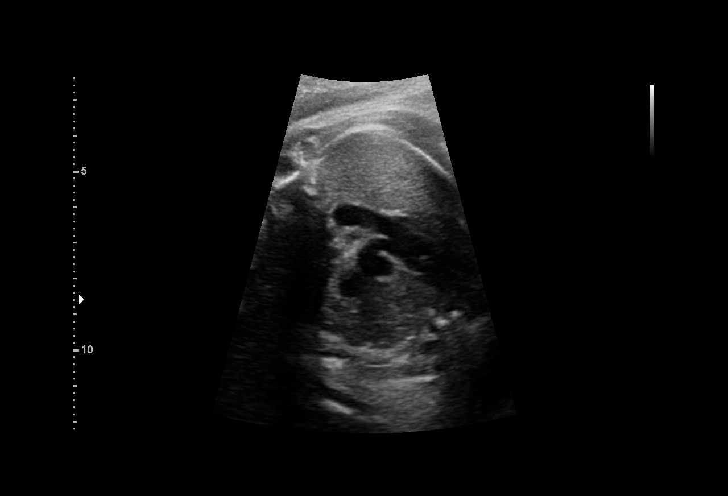
[im 36/44]
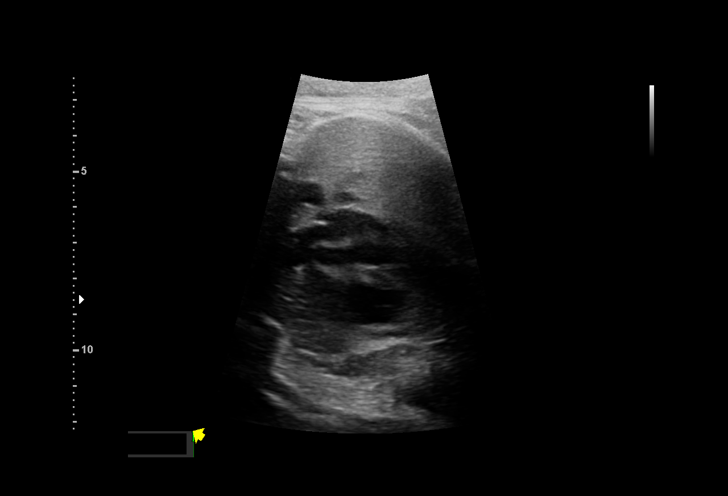
[im 39/44]
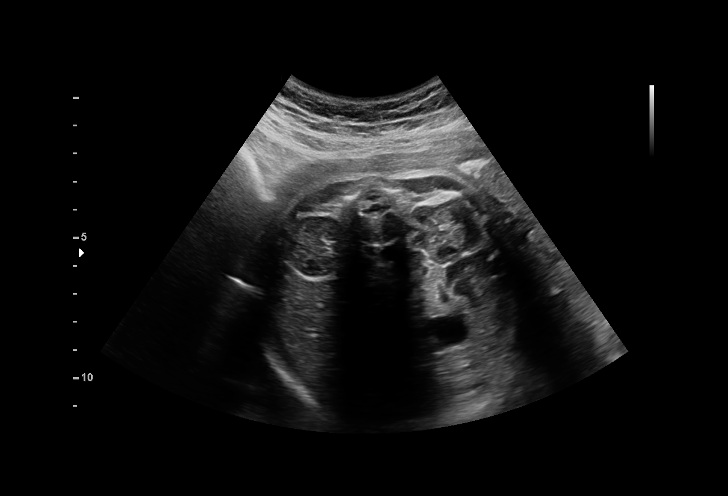
[im 42/44]
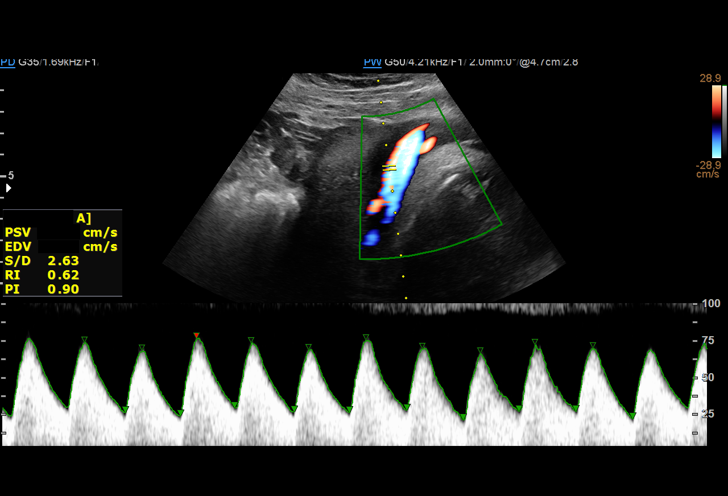

[13 of 28 positions shown; findings below may reference images not displayed]

JUMPER
 3  US MFM UA CORD DOPPLER                76820.02    TARLA
                                                      JUMPER

Indications

 36 weeks gestation of pregnancy
 Maternal care for known or suspected poor
 fetal growth, third trimester, not applicable or
 unspecified IUGR
 Encounter for antenatal screening,
 unspecified
Fetal Evaluation

 Num Of Fetuses:         1
 Fetal Heart Rate(bpm):  147
 Cardiac Activity:       Observed
 Presentation:           Cephalic
 Placenta:               Anterior
 P. Cord Insertion:      Previously Visualized

 Amniotic Fluid
 AFI FV:      Within normal limits

 AFI Sum(cm)     %Tile       Largest Pocket(cm)
 14.65           54

 RUQ(cm)       RLQ(cm)       LUQ(cm)        LLQ(cm)

Biophysical Evaluation
 Amniotic F.V:   Within normal limits       F. Tone:        Observed
 F. Movement:    Observed                   Score:          [DATE]
 F. Breathing:   Observed
Biometry

 BPD:      88.4  mm     G. Age:  35w 5d         44  %    CI:        80.87   %    70 - 86
                                                         FL/HC:      20.6   %    20.1 -
 HC:      310.4  mm     G. Age:  34w 5d        2.8  %    HC/AC:      1.06        0.93 -
 AC:      293.9  mm     G. Age:  33w 3d        2.6  %    FL/BPD:     72.4   %    71 - 87
 FL:         64  mm     G. Age:  33w 0d        < 1  %    FL/AC:      21.8   %    20 - 24
 HUM:      55.6  mm     G. Age:  32w 3d        < 5  %

 Est. FW:    3380  gm    4 lb 15 oz     4.1  %
OB History

 Gravidity:    1
 Living:       0
Gestational Age

 LMP:           36w 2d        Date:  06/05/20                 EDD:   03/12/21
 U/S Today:     34w 2d                                        EDD:   03/26/21
 Best:          36w 2d     Det. By:  LMP  (06/05/20)          EDD:   03/12/21
Anatomy

 Cranium:               Appears normal         Aortic Arch:            Previously seen
 Cavum:                 Not well visualized    Ductal Arch:            Appears normal
 Ventricles:            Previously seen        Diaphragm:              Previously seen
 Choroid Plexus:        Not well visualized    Stomach:                Appears normal, left
                                                                       sided
 Cerebellum:            Not well visualized    Abdomen:                Previously seen
 Posterior Fossa:       Not well visualized    Abdominal Wall:         Not well visualized
 Nuchal Fold:           Not applicable (>20    Cord Vessels:           Previously seen
                        wks GA)
 Face:                  Orbits nl; profile     Kidneys:                Appear normal
                        limited
 Lips:                  Previously seen        Bladder:                Appears normal
 Thoracic:              Previously seen        Spine:                  Previously seen
 Heart:                 Appears normal         Upper Extremities:      Previously seen
                        (4CH, axis, and
                        situs)
 RVOT:                  Previously seen        Lower Extremities:      Previously seen
 LVOT:                  Previously seen

 Other:  Technically difficult due to advanced gestational age. 3VT visualized
Doppler - Fetal Vessels

 Umbilical Artery
  S/D     %tile      RI    %tile      PI    %tile            ADFV    RDFV
  2.55       60    0.61       68    0.88       63               No      No

Cervix Uterus Adnexa
 Cervix
 Not visualized (advanced GA >81wks)

 Right Ovary
 Visualized.

 Left Ovary
 Visualized.
Impression

 Severe fetal growth restriction.  Patient returned for fetal
 growth assessment and antenatal testing.

 Amniotic fluid is normal and good fetal activity seen.  The
 estimated fetal weight is in the 4th percentile.  Interval weight
 gain is 583 g.  Umbilical artery Doppler showed normal
 forward diastolic flow.Antenatal testing is reassuring. BPP
 [DATE].

 Patient reports she will be undergoing induction of labor next
 week.  She reports good fetal movements.
                 Schuette, Brunilda
